# Patient Record
Sex: Female | Born: 1990 | Race: Black or African American | Hispanic: No | Marital: Single
Health system: Midwestern US, Academic
[De-identification: ages and names within clinical notes are randomized; demographics above are authoritative.]

## PROBLEM LIST (undated history)

## (undated) DIAGNOSIS — D693 Immune thrombocytopenic purpura: Secondary | ICD-10-CM

## (undated) DIAGNOSIS — O24419 Gestational diabetes mellitus in pregnancy, unspecified control: Secondary | ICD-10-CM

## (undated) DIAGNOSIS — O99113 Other diseases of the blood and blood-forming organs and certain disorders involving the immune mechanism complicating pregnancy, third trimester: Secondary | ICD-10-CM

## (undated) HISTORY — DX: Other diseases of the blood and blood-forming organs and certain disorders involving the immune mechanism complicating pregnancy, third trimester: D69.3

## (undated) HISTORY — DX: Immune thrombocytopenic purpura: O99.113

## (undated) HISTORY — DX: Gestational diabetes mellitus in pregnancy, unspecified control: O24.419

## (undated) LAB — PREPARE FRESH FROZEN PLASMA
Blood Expiration Date: 202403061619
Blood Expiration Date: 202403061619
Blood Expiration Date: 202403061947
Blood Expiration Date: 202403071454
Blood Expiration Date: 202403071454
Blood Expiration Date: 202403071454
Dispense Status: TRANSFUSED
Dispense Status: TRANSFUSED

## (undated) LAB — PREPARE RBC, LEUKOREDUCED
Blood Expiration Date: 202404012359
Blood Expiration Date: 202404012359
Blood Expiration Date: 202404012359
Blood Expiration Date: 202404012359
Blood Expiration Date: 202404022359
Blood Expiration Date: 202404022359
Dispense Status: TRANSFUSED
Dispense Status: TRANSFUSED

---

## 2012-04-10 HISTORY — PX: WISDOM TOOTH EXTRACTION: SHX21

## 2018-02-11 NOTE — Anesthesia Procedure Notes (Signed)
Associated Order(s): Anesthesia Block  Formatting of this note might be different from the original.  Anesthesia Block  Performed by: Leland Johns, DO  Date/Time: 02/11/2018 12:10 PM    Block Type:  Epidural - lumbarResident performed procedure    Timeout Time:  12:10  Block start date/time: 02/11/2018 12:10 PM  Laterality:  N/A  Indication: vaginal delivery  Sedation/Anelgesia for procedure: see MAR for details  Monitoring: continuous pulse oximetry, EKG, room air and non-invasive blood pressure  Prep: sterile gloves, cap, pre-procedure hand washing completed, sterile drape and surgical mask    Solution: Chlorhexidine    Preanesthetic Checklist: Patient identified, IV checked, Anesthesia informed consent obtained, Timeout performed, surgical consent and Pre- Anesthesia Record Complete    Needle:     Injection Technique:  Catheter    Type: Tuohy    Gauge: 18 G    Length: 9 cm    Catheter at Skin Depth:  11 cm  Site:  Interspace:  L3-4  Technique:   loss of resistance       Fixed to Skin with:  Clear adhesive dressing applied and Medical skin adhesive  Local Anesthetics:    Medications: Documented on Intra-procedure meds section (see MAR for details)     Test Dose:  1.5% lidocaine with Epi 1:200 K and negative    Dose:  5 mL    Injection made incrementally with aspirations every:  3 mL  Narrative:     Remarks:  No Blood Aspirated, No Air Aspirated, No Parasthesia and Injection without significant resistance    Percutaneous Insertions:  1  Notes:      Aseptic technique.  midline approach at L3-L4. Skin topicalized with 1% lidocaine.  Clear LOR to NS at 6 cm.  Catheter threaded easily without significant resistance or paraesthesia.  Negative aspiration and negative test dose. Catheter secured to skin at 11 cm with mastisol, tegaderm, and [place] paper tape.  Patient tolerated procedure well without immediate complications. Performed by Leland Johns, DO  under supervision of Dr. Mollie Germany.      Electronically signed by  Leland Johns, DO at 02/11/2018 12:30 PM EST

## 2018-02-11 NOTE — Anesthesia Pre-Procedure Evaluation (Signed)
Formatting of this note is different from the original.  ANESTHESIA EVALUATION    Patient Active Problem List   Diagnosis   ? Migraine   ? Depression   ? Supervision of other normal pregnancy, antepartum   ? Labor and delivery, indication for care   ? [redacted] weeks gestation of pregnancy     SUMMARY  Emily Haynes is a 27 y.o. [phone] F with [redacted]w[redacted]d who presents in labor. Patient requesting epidural for labor analgesia at this time.    Review of Systems / Medical History  Patient summary reviewed and Nursing notes reviewed  Anesthesia history - negative   (-)  PONV,  malignant hyperthermia  (-) family history of anesthetic complications  Pulmonary - negative ROS    (-) active smoker, COPD, asthma, shortness of breath  Neurological   (+) headaches (Tx with ibuprofen ),   (-)  seizures, TIA, CVA  Psychiatric   (+) depression,     Cardiovascular   Exercise tolerance: > 4 METs (active) (-) hypertension,  congenital heart disease, valvular problems/murmurs, no chest pain, angina  GI/Hepatic/Renal   (-) GERD, liver disease, renal disease  Endo/Other   (-) hypothyroidism, hyperthyroidism, no anemia, no coagulopathy    Physical Exam  Airway  Mallampati: II  TM distance: >3 FB  Neck ROM: full  Mouth Opening:good    Dental- normal exam    Cardiovascular    Rhythm: regular  Rate: normal   Pulmonary   breath sounds clear to auscultation    Mental Status   (+) alert and coherent  Abdomen   Other findings: Gravid uterus    Lab Results   Component Value Date    WBC 7.2 02/10/2018    HGB 8.4 (L) 02/10/2018    HCT 27.3 (L) 02/10/2018    MCV 70.0 (L) 02/10/2018    PLAT 137 (L) 02/10/2018    PLAT 160 06/12/2011     Anesthesia Plan  ASA:  2   Planned Anesthesia Type:  Epidural  Lines and Monitors: PIV    Informed Consent: Anesthetic plan and risks discussed with patient.  Use of blood products discussed with patient who.      Plan discussed with Resident and Attending.   ______________________________________    Electronically signed by  Karsten Ro, MD at 02/15/2018  2:33 PM EST    Associated attestation - Karsten Ro, MD - 02/15/2018  2:33 PM EST  Formatting of this note might be different from the original.  Agree with the above as documented by  Dr. Sherilyn Cooter.  Ilean China, MD

## 2018-02-11 NOTE — Anesthesia Post-Procedure Evaluation (Signed)
Formatting of this note might be different from the original.  Post Anesthesia Evaluation    Vital signs reviewed.    RESPIRATORY  Airway: maintains patency  Respiratory rate: within expected parameters  Oxygenation source: room air    CARDIOVASCULAR  Pulse: within expected parameters  Blood pressure: within expected parameters    MENTAL STATUS: Awake, alert, and appropriate.  At pre-procedure baseline.    TEMPERATURE: within expected parameters    PAIN: Adequately controlled. Post-procedure orders written.    NAUSEA/VOMITING: No acute nausea or vomiting    HYDRATION: appears adequately hydrated    I have evaluated this patient and spoken with the PACU nurse responsible for this patient. I have found that the above criteria have been met. Apparent complications attributable to anesthesia at this time none.    Epidural removed without incident. Tip intact.    Christia Reading, DO  Anesthesiology and Perioperative Medicine, PGY-2/CA-1  Pager [phone]  02/11/18      Electronically signed by Christia Reading, DO at 02/11/2018  9:25 PM EST

## 2019-04-02 ENCOUNTER — Inpatient Hospital Stay (HOSPITAL_COMMUNITY)
Admission: AD | Admit: 2019-04-02 | Discharge: 2019-04-02 | Disposition: A | Discharge status: Home / Self Care | Payer: Medicaid Other | Attending: Obstetrics and Gynecology | Admitting: Obstetrics and Gynecology

## 2019-04-02 ENCOUNTER — Encounter (HOSPITAL_COMMUNITY): Payer: Self-pay | Admitting: Obstetrics and Gynecology

## 2019-04-02 ENCOUNTER — Other Ambulatory Visit: Payer: Self-pay

## 2019-04-02 DIAGNOSIS — Z3201 Encounter for pregnancy test, result positive: Secondary | ICD-10-CM | POA: Diagnosis not present

## 2019-04-02 DIAGNOSIS — Z3A09 9 weeks gestation of pregnancy: Secondary | ICD-10-CM | POA: Insufficient documentation

## 2019-04-02 DIAGNOSIS — O210 Mild hyperemesis gravidarum: Secondary | ICD-10-CM

## 2019-04-02 LAB — URINALYSIS, ROUTINE W REFLEX MICROSCOPIC
Bilirubin Urine: NEGATIVE
Glucose, UA: NEGATIVE mg/dL
Hgb urine dipstick: NEGATIVE
Ketones, ur: NEGATIVE mg/dL
Leukocytes,Ua: NEGATIVE
Nitrite: NEGATIVE
Protein, ur: NEGATIVE mg/dL
Specific Gravity, Urine: 1.009 (ref 1.005–1.030)
pH: 6 (ref 5.0–8.0)

## 2019-04-02 LAB — POCT PREGNANCY, URINE: Preg Test, Ur: POSITIVE — AB

## 2019-04-02 NOTE — Discharge Instructions (Signed)
Morning Sickness  Morning sickness is when you feel sick to your stomach (nauseous) during pregnancy. You may feel sick to your stomach and throw up (vomit). You may feel sick in the morning, but you can feel this way at any time of day. Some women feel very sick to their stomach and cannot stop throwing up (hyperemesis gravidarum). Follow these instructions at home: Medicines  Take over-the-counter and prescription medicines only as told by your doctor. Do not take any medicines until you talk with your doctor about them first.  Taking multivitamins before getting pregnant can stop or lessen the harshness of morning sickness. Eating and drinking  Eat dry toast or crackers before getting out of bed.  Eat 5 or 6 small meals a day.  Eat dry and bland foods like rice and baked potatoes.  Do not eat greasy, fatty, or spicy foods.  Have someone cook for you if the smell of food causes you to feel sick or throw up.  If you feel sick to your stomach after taking prenatal vitamins, take them at night or with a snack.  Eat protein when you need a snack. Nuts, yogurt, and cheese are good choices.  Drink fluids throughout the day.  Try ginger ale made with real ginger, ginger tea made from fresh grated ginger, or ginger candies. General instructions  Do not use any products that have nicotine or tobacco in them, such as cigarettes and e-cigarettes. If you need help quitting, ask your doctor.  Use an air purifier to keep the air in your house free of smells.  Get lots of fresh air.  Try to avoid smells that make you feel sick.  Try: ? Wearing a bracelet that is used for seasickness (acupressure wristband). ? Going to a doctor who puts thin needles into certain body points (acupuncture) to improve how you feel. Contact a doctor if:  You need medicine to feel better.  You feel dizzy or light-headed.  You are losing weight. Get help right away if:  You feel very sick to your  stomach and cannot stop throwing up.  You pass out (faint).  You have very bad pain in your belly. Summary  Morning sickness is when you feel sick to your stomach (nauseous) during pregnancy.  You may feel sick in the morning, but you can feel this way at any time of day.  Making some changes to what you eat may help your symptoms go away. This information is not intended to replace advice given to you by your health care provider. Make sure you discuss any questions you have with your health care provider. Document Released: 05/04/2004 Document Revised: 03/09/2017 Document Reviewed: 04/27/2016 Elsevier Patient Education  2020 [place] for [employer] at [place]      Phone: [phone]  [facility] [employer] at [place]   Phone: [place] for [employer] at [place]  Phone: [place] for [place] at [facility]  Phone: [place] for [place] at [place]  Phone: [place] for [place] at [facility]   Phone: [facility]       Phone: [phone]  [facility]    Phone: [phone]   [facility]    Phone: [phone]  [facility]    Phone: [phone]  [place]    Phone: [phone]  [facility]  Department-Family Planning       Phone: [phone]   [facility]-Maternity  Phone: [phone]  [facility]    Phone: [phone]  [facility]   Phone: [phone]  [facility]      Phone: [phone]  [facility]    Phone: [phone]     ________________________________________     To schedule your Maternity Eligibility Appointment, please call [phone].  When you arrive for your  appointment you must bring the following items or information listed below.  Your appointment will be rescheduled if you do not have these items or are 15 minutes late. If currently receiving Medicaid, you MUST bring: 1. Medicaid Card 2. Social Security Card 3. Picture ID 4. Proof of Pregnancy 5. Verification of current address if the address on Medicaid card is incorrect "postmarked mail" If not receiving Medicaid, you MUST bring: 1. Social Security Card 2. Picture ID 3. Birth Certificate (if available) Passport or *Green Card 4. Proof of Pregnancy 5. Verification of current address "postmarked mail" for each income presented. 6. Verification of insurance coverage, if any 7. Check stubs from each employer for the previous month (if unable to present check stub  for each week, we will accept check stub for the first and last week ill the same month.) If you can't locate check stubs, you must bring a letter from the employer(s) and it must have the following information on letterhead, typed, in English: o name of company o company telephone number o how long been with the company, if less than one month o how much person earns per hour o how many hours per week work o the gross pay the person earned for the previous month If you are 30 years old or less, you do not have to bring proof of income unless you work or live with the father of the baby and at that time we will need proof of income from you and/or the father of the baby. Green Card recipients are eligible for Medicaid for Pregnant Women (MPW)

## 2019-04-02 NOTE — MAU Provider Note (Signed)
  History     CSN: [id number]  Arrival date and time: 04/02/19 1432   First Provider Initiated Contact with Patient 04/02/19 1531      Chief Complaint  Patient presents with  . Nausea  . Possible Pregnancy   28 y.o. G1 @[redacted]w[redacted]d  by LMP presenting with suspicion of pregnancy. Reports recent onset of nausea and fatigue. She has not taken a HPT. Reports nausea in her first pregnancy for which she took medicine but unsure of name. She recently moved here, does not have OBGYN.   OB History    Gravida  2   Para  1   Term      Preterm      AB      Living  1     SAB      TAB      Ectopic      Multiple      Live Births              No past medical history on file.  No family history on file.  Social History   Tobacco Use  . Smoking status: Not on file  Substance Use Topics  . Alcohol use: Not on file  . Drug use: Not on file    Allergies: Not on File  No medications prior to admission.    Review of Systems  Constitutional: Positive for fatigue.  Gastrointestinal: Positive for nausea. Negative for abdominal pain and vomiting.  Genitourinary: Negative for vaginal bleeding.   Physical Exam   Blood pressure 119/80, pulse 83, temperature 99.3 F (37.4 C), temperature source Oral, resp. rate 17, weight 78.7 kg, last menstrual period 01/26/2019, SpO2 100 %.  Physical Exam  Nursing note and vitals reviewed. Constitutional: She is oriented to person, place, and time. She appears well-developed and well-nourished. No distress.  HENT:  Head: Normocephalic and atraumatic.  Cardiovascular: Normal rate.  Respiratory: Effort normal. No respiratory distress.  Musculoskeletal:        General: Normal range of motion.     Cervical back: Normal range of motion.  Neurological: She is alert and oriented to person, place, and time.  Psychiatric: She has a normal mood and affect.   Results for orders placed or performed during the hospital encounter of 04/02/19 (from  the past 24 hour(s))  Pregnancy, urine POC     Status: Abnormal   Collection Time: 04/02/19  3:19 PM  Result Value Ref Range   Preg Test, Ur POSITIVE (A) NEGATIVE   MAU Course  Procedures  MDM +UPT, pt notified. Discussed OTC treatments for morning sickness- list provided. Pregnancy verification letter and OB provider list provided. Stable for discharge home.   Assessment and Plan   1. Positive pregnancy test   2. Morning sickness    Discharge home Follow up with OB provider of choice to start care Return precautions  Allergies as of 04/02/2019   Not on File     Medication List    You have not been prescribed any medications.    Julianne Handler, CNM 04/02/2019, 3:51 PM

## 2019-04-02 NOTE — MAU Note (Signed)
Had implant removed 12/23/18, has only had one period since removed- only lasted 2 days..  Hasn't seen period for 1 or 2 months.  Having some nausea, fatigue. Did not do home test. "no pain, just feeling ? Kicking, or things moving?"

## 2019-04-11 NOTE — L&D Delivery Note (Addendum)
OB/GYN Faculty Practice Delivery Note  Kaitlin [name] is a 29 y.o. [id number] s/p vaginal delivery at [redacted]w[redacted]d. She was admitted for IOL 2/2 uncontrolled GDM.   ROM: 3h 71m with clear fluid GBS Status: negative Maximum Maternal Temperature: 98.8*F  Labor Progress: . Admitted for IOL 2/2 uncontrolled diabetes. Cervix was favorable, so she was started on pitocin. She was eventually AROmed and progressed to complete.  Delivery Date/Time: 10/30/19, 17:04  Delivery: Called to room and patient was complete and head was crowning. Head delivered OA by Dr. Germaine Pomfret. No nuchal cord present, however shoulder dystocia was noted when the head did not restitute. Shoulder dystocia identified and help called for. Foley catheter remained in place as she had an epidural for pain relief.  00:00 Head delivered 01:00 Shoulder dystocia identified and help called for 01:30 Dr. Salomon Mast took over for Dr. Germaine Pomfret, Wood's Screw attempted 02:00 Placed onto side and posterior arm attempted 02:30 Dr. Reina Fuse and Dr. Juliene Pina at bedside 02:35 Dr. Reina Fuse took over for Dr. Salomon Mast 02:45 Dr. Vergie Living at bedside  03:00 Dr. Reina Fuse attempted posterior arm 03:30 Dr. Reina Fuse cut episiotomy 03:45 Humerus broken, posterior arm delivered 04:00 Delivery of the infant   Cord immediately clamped x 2 and cut by Dr. Germaine Pomfret, infant brought immediately to warmer by Dr. Salomon Mast for resuscitation by awaiting Neonatology team. Arterial and venous cord gases drawn. Cord blood drawn. Placenta delivered spontaneously with gentle cord traction.   Due to blood loss, 1 g TXA was given, as was 0.2 mg Methergine IM. Fundus firm with massage and Pitocin. Labia, perineum, vagina, and cervix were inspected, and a 3A laceration was noted- repaired with 2-0 Vicryl in the usual fashion with the assistance of Dr. Vergie Living.   After repair, there was another large gush of blood with fundal massage. 1000 mcg rectal cytotec given at that time and the  uterus was swept with about 300 cc of clot removed.  2 g Ancef ordered for both 3A lac repair and manual uterine sweep.  Placenta: intact, 3 vessel cord, to L&D Complications: 4:00 shoulder dystocia, as outlined above Lacerations: 3A laceration, 0.5cm in depth- repaired with Vicryl in the usual fashion QBL: 1809 mL Analgesia: epidural, 1%lidocaine for repair  Postpartum Planning [x]  message to sent to schedule follow-up  [x]  vaccines UTD  Infant: female  APGARs 9 at 5 minutes  4380 g  , DO OB/GYN Fellow, Faculty Practice

## 2019-04-17 ENCOUNTER — Ambulatory Visit (INDEPENDENT_AMBULATORY_CARE_PROVIDER_SITE_OTHER): Payer: Self-pay

## 2019-04-17 ENCOUNTER — Telehealth: Payer: Self-pay

## 2019-04-17 DIAGNOSIS — Z8751 Personal history of pre-term labor: Secondary | ICD-10-CM | POA: Insufficient documentation

## 2019-04-17 DIAGNOSIS — O0990 Supervision of high risk pregnancy, unspecified, unspecified trimester: Secondary | ICD-10-CM

## 2019-04-17 DIAGNOSIS — O09899 Supervision of other high risk pregnancies, unspecified trimester: Secondary | ICD-10-CM

## 2019-04-17 MED ORDER — BLOOD PRESSURE KIT DEVI: 1.0000 | 0 refills | Status: DC | PRN

## 2019-04-17 MED ORDER — DOXYLAMINE-PYRIDOXINE 10-10 MG PO TBEC: DELAYED_RELEASE_TABLET | ORAL | 6 refills | Status: DC

## 2019-04-17 NOTE — Progress Notes (Signed)
I have reviewed this chart and agree with the RN/CMA assessment and management.    K. Meryl Davis, M.D. Attending [facility] (Faculty Practice)   

## 2019-04-17 NOTE — Telephone Encounter (Signed)
Pt LVM  Unable to reach pt; message says "the person you are trying to reach is not taking calls at this time"

## 2019-04-17 NOTE — Patient Instructions (Addendum)
Preventing Preterm Birth Preterm birth is when your baby is delivered between 20 weeks and 37 weeks of pregnancy. A full-term pregnancy lasts for at least 37 weeks. Preterm birth can be dangerous for your baby because the last few weeks of pregnancy are an important time for your baby's brain and lungs to grow. Many things can cause a baby to be born early. Sometimes the cause is not known. There are certain factors that make you more likely to experience preterm birth, such as:  Having a previous baby born preterm.  Being pregnant with twins or other multiples.  Having had fertility treatment.  Being overweight or underweight at the start of your pregnancy.  Having any of the following during pregnancy: ? An infection, including a urinary tract infection (UTI) or an STI (sexually transmitted infection). ? High blood pressure. ? Diabetes. ? Vaginal bleeding.  Being age 35 or older.  Being age 18 or younger.  Getting pregnant within 6 months of a previous pregnancy.  Suffering extreme stress or physical or emotional abuse during pregnancy.  Standing for long periods of time during pregnancy, such as working at a job that requires standing. What are the risks? The most serious risk of preterm birth is that the baby may not survive. This is more likely to happen if a baby is born before 34 weeks. Other risks and complications of preterm birth may include your baby having:  Breathing problems.  Brain damage that affects movement and coordination (cerebral palsy).  Feeding difficulties.  Vision or hearing problems.  Infections or inflammation of the digestive tract (colitis).  Developmental delays.  Learning disabilities.  Higher risk for diabetes, heart disease, and high blood pressure later in life. What can I do to lower my risk?  Medical care The most important thing you can do to lower your risk for preterm birth is to get routine medical care during pregnancy (prenatal  care). If you have a high risk of preterm birth, you may be referred to a health care provider who specializes in managing high-risk pregnancies (perinatologist). You may be given medicine to help prevent preterm birth. Lifestyle changes Certain lifestyle changes can also lower your risk of preterm birth:  Wait at least 6 months after a pregnancy to become pregnant again.  Try to plan pregnancy for when you are between 19 and 35 years old.  Get to a healthy weight before getting pregnant. If you are overweight, work with your health care provider to safely lose weight.  Do not use any products that contain nicotine or tobacco, such as cigarettes and e-cigarettes. If you need help quitting, ask your health care provider.  Do not drink alcohol.  Do not use drugs. Where to find support For more support, consider:  Talking with your health care provider.  Talking with a therapist or substance abuse counselor, if you need help quitting.  Working with a diet and nutrition specialist (dietitian) or a personal trainer to maintain a healthy weight.  Joining a support group. Where to find more information Learn more about preventing preterm birth from:  Centers for Disease Control and Prevention: cdc.gov/reproductivehealth/maternalinfanthealth/pretermbirth.htm  March of Dimes: marchofdimes.org/complications/premature-babies.aspx  American Pregnancy Association: americanpregnancy.org/labor-and-birth/premature-labor Contact a health care provider if:  You have any of the following signs of preterm labor before 37 weeks: ? A change or increase in vaginal discharge. ? Fluid leaking from your vagina. ? Pressure or cramps in your lower abdomen. ? A backache that does not go away or gets worse. ?   Regular tightening (contractions) in your lower abdomen. Summary  Preterm birth means having your baby during weeks 20-37 of pregnancy.  Preterm birth may put your baby at risk for physical and  mental problems.  Getting good prenatal care can help prevent preterm birth.  You can lower your risk of preterm birth by making certain lifestyle changes, such as not smoking and not using alcohol. This information is not intended to replace advice given to you by your health care provider. Make sure you discuss any questions you have with your health care provider. Document Revised: 03/09/2017 Document Reviewed: 12/04/2015 Elsevier Patient Education  2020 Else[employer] Inc[employer]   Morning Sickness  Morning sickness is when you feel sick to your stomach (nauseous) during pregnancy. You may feel sick to your stomach and throw up (vomit). You may feel sick in the morning, but you can feel this way at any time of day. Some women feel very sick to their stomach and cannot stop throwing up (hyperemesis gravidarum). Follow these instructions at home: Medicines  Take over-the-counter and prescription medicines only as told by your doctor. Do not take any medicines until you talk with your doctor about them first.  Taking multivitamins before getting pregnant can stop or lessen the harshness of morning sickness. Eating and drinking  Eat dry toast or crackers before getting out of bed.  Eat 5 or 6 small meals a day.  Eat dry and bland foods like rice and baked potatoes.  Do not eat greasy, fatty, or spicy foods.  Have someone cook for you if the smell of food causes you to feel sick or throw up.  If you feel sick to your stomach after taking prenatal vitamins, take them at night or with a snack.  Eat protein when you need a snack. Nuts, yogurt, and cheese are good choices.  Drink fluids throughout the day.  Try ginger ale made with real ginger, ginger tea made from fresh grated ginger, or ginger candies. General instructions  Do not use any products that have nicotine or tobacco in them, such as cigarettes and e-cigarettes. If you need help quitting, ask your doctor.  Use an air purifier to  keep the air in your house free of smells.  Get lots of fresh air.  Try to avoid smells that make you feel sick.  Try: ? Wearing a bracelet that is used for seasickness (acupressure wristband). ? Going to a doctor who puts thin needles into certain body points (acupuncture) to improve how you feel. Contact a doctor if:  You need medicine to feel better.  You feel dizzy or light-headed.  You are losing weight. Get help right away if:  You feel very sick to your stomach and cannot stop throwing up.  You pass out (faint).  You have very bad pain in your belly. Summary  Morning sickness is when you feel sick to your stomach (nauseous) during pregnancy.  You may feel sick in the morning, but you can feel this way at any time of day.  Making some changes to what you eat may help your symptoms go away. This information is not intended to replace advice given to you by your health care provider. Make sure you discuss any questions you have with your health care provider. Document Revised: 03/09/2017 Document Reviewed: 04/27/2016 Elsevier Patient Education  2020 [employer].

## 2019-04-17 NOTE — Progress Notes (Signed)
  Virtual Visit via Telephone Note  I connected with Kaitlin [name] on 04/17/19 at 10:30 AM EST by telephone and verified that I am speaking with the correct person using two identifiers.  Location: Patient: Kaitlin [name]  Provider: Eduard Roux, CMA    I discussed the limitations, risks, security and privacy concerns of performing an evaluation and management service by telephone and the availability of in person appointments. I also discussed with the patient that there may be a patient responsible charge related to this service. The patient expressed understanding and agreed to proceed.   History of Present Illness: PRENATAL INTAKE SUMMARY  Kaitlin [name] presents today New OB Nurse Interview.  OB History    Gravida  5   Para  4   Term  3   Preterm  1   AB      Living  4     SAB      TAB      Ectopic      Multiple      Live Births  4          I have reviewed the patient's medical, obstetrical, social, and family histories, medications, and available lab results.  SUBJECTIVE She c/o morning sickness.   Observations/Objective: Initial nurse interview for history/labs (New OB)  EDD: 11/02/2019 GA: [redacted]w[redacted]d GP: [id number] FHT: non face to face interview   GENERAL APPEARANCE: alert, non face to face interview   Assessment and Plan: High Risk pregnancy hx of PTL @ 36 weeks Hx of Makena use - pt hesitate to receive inj this pregnancy Prenatal care - [facility]  Labs to be completed at provider visit Pt to download Babyscripts explained in detail Pt to download Mychart BP cuff ordered Diclegis sent to pharmacy per protocol    Follow Up Instructions:   I discussed the assessment and treatment plan with the patient. The patient was provided an opportunity to ask questions and all were answered. The patient agreed with the plan and demonstrated an understanding of the instructions.   The patient was advised to call back or seek an in-person evaluation if the symptoms  worsen or if the condition fails to improve as anticipated.  I provided 20 minutes of non-face-to-face time during this encounter.   Dalphine Handing, CMA

## 2019-04-23 ENCOUNTER — Other Ambulatory Visit (HOSPITAL_COMMUNITY)
Admission: RE | Admit: 2019-04-23 | Discharge: 2019-04-23 | Disposition: A | Discharge status: Home / Self Care | Payer: Medicaid Other | Source: Ambulatory Visit | Attending: Obstetrics | Admitting: Obstetrics

## 2019-04-23 ENCOUNTER — Other Ambulatory Visit: Payer: Self-pay

## 2019-04-23 ENCOUNTER — Encounter: Payer: Self-pay | Admitting: Obstetrics

## 2019-04-23 ENCOUNTER — Ambulatory Visit (INDEPENDENT_AMBULATORY_CARE_PROVIDER_SITE_OTHER): Payer: Self-pay | Admitting: Obstetrics

## 2019-04-23 VITALS — BP 122/75 | HR 89 | Temp 99.0°F | Wt 174.4 lb

## 2019-04-23 DIAGNOSIS — O09891 Supervision of other high risk pregnancies, first trimester: Secondary | ICD-10-CM

## 2019-04-23 DIAGNOSIS — O0990 Supervision of high risk pregnancy, unspecified, unspecified trimester: Secondary | ICD-10-CM | POA: Diagnosis not present

## 2019-04-23 DIAGNOSIS — Z3A12 12 weeks gestation of pregnancy: Secondary | ICD-10-CM

## 2019-04-23 DIAGNOSIS — O0991 Supervision of high risk pregnancy, unspecified, first trimester: Secondary | ICD-10-CM

## 2019-04-23 DIAGNOSIS — O09899 Supervision of other high risk pregnancies, unspecified trimester: Secondary | ICD-10-CM

## 2019-04-23 NOTE — Progress Notes (Signed)
Subjective:    Kaitlin [name] is being seen today for her first obstetrical visit.  This is a planned pregnancy. She is at [redacted]w[redacted]d gestation. Her obstetrical history is significant for previous preterm delivery. Relationship with FOB: spouse, living together. Patient does intend to breast feed. Pregnancy history fully reviewed.  The information documented in the HPI was reviewed and verified.  Menstrual History: OB History    Gravida  5   Para  4   Term  3   Preterm  1   AB      Living  4     SAB      TAB      Ectopic      Multiple      Live Births  4            Patient's last menstrual period was 01/26/2019 (approximate).    History reviewed. No pertinent past medical history.  Past Surgical History:  Procedure Laterality Date  . WISDOM TOOTH EXTRACTION  2014    (Not in a hospital admission)  No Known Allergies  Social History   Tobacco Use  . Smoking status: Never Smoker  . Smokeless tobacco: Never Used  Substance Use Topics  . Alcohol use: Never    History reviewed. No pertinent family history.   Review of Systems Constitutional: negative for weight loss Gastrointestinal: negative for vomiting Genitourinary:negative for genital lesions and vaginal discharge and dysuria Musculoskeletal:negative for back pain Behavioral/Psych: negative for abusive relationship, depression, illegal drug usage and tobacco use    Objective:    BP 122/75   Pulse 89   Temp 99 F (37.2 C)   Wt 174 lb 6.4 oz (79.1 kg)   LMP 01/26/2019 (Approximate)   BMI 29.94 kg/m  General Appearance:    Alert, cooperative, no distress, appears stated age  Head:    Normocephalic, without obvious abnormality, atraumatic  Eyes:    PERRL, conjunctiva/corneas clear, EOM's intact, fundi    benign, both eyes  Ears:    Normal TM's and external ear canals, both ears  Nose:   Nares normal, septum midline, mucosa normal, no drainage    or sinus tenderness  Throat:   Lips, mucosa, and  tongue normal; teeth and gums normal  Neck:   Supple, symmetrical, trachea midline, no adenopathy;    thyroid:  no enlargement/tenderness/nodules; no carotid   bruit or JVD  Back:     Symmetric, no curvature, ROM normal, no CVA tenderness  Lungs:     Clear to auscultation bilaterally, respirations unlabored  Chest Wall:    No tenderness or deformity   Heart:    Regular rate and rhythm, S1 and S2 normal, no murmur, rub   or gallop  Breast Exam:    No tenderness, masses, or nipple abnormality  Abdomen:     Soft, non-tender, bowel sounds active all four quadrants,    no masses, no organomegaly  Genitalia:    Normal female without lesion, discharge or tenderness  Extremities:   Extremities normal, atraumatic, no cyanosis or edema  Pulses:   2+ and symmetric all extremities  Skin:   Skin color, texture, turgor normal, no rashes or lesions  Lymph nodes:   Cervical, supraclavicular, and axillary nodes normal  Neurologic:   CNII-XII intact, normal strength, sensation and reflexes    throughout      Lab Review Urine pregnancy test Labs reviewed yes Radiologic studies reviewed no Assessment:    Pregnancy at [redacted]w[redacted]d weeks    Plan:  1. Supervision of high risk pregnancy, antepartum Rx: - Obstetric Panel, Including HIV - Hemoglobinopathy evaluation - Cystic Fibrosis Mutation 97 - SMN1 Copy Number Analysis - Urine Culture - HgB A1c - Glucose - Cytology - PAP( [place]) - Cervicovaginal ancillary only( [place])  2. History of preterm delivery, currently pregnant   Prenatal vitamins.  Counseling provided regarding continued use of seat belts, cessation of alcohol consumption, smoking or use of illicit drugs; infection precautions i.e., influenza/TDAP immunizations, toxoplasmosis,CMV, parvovirus, listeria and varicella; workplace safety, exercise during pregnancy; routine dental care, safe medications, sexual activity, hot tubs, saunas, pools, travel, caffeine use, fish and  methlymercury, potential toxins, hair treatments, varicose veins Weight gain recommendations per IOM guidelines reviewed: underweight/BMI< 18.5--> gain 28 - 40 lbs; normal weight/BMI 18.5 - 24.9--> gain 25 - 35 lbs; overweight/BMI 25 - 29.9--> gain 15 - 25 lbs; obese/BMI >30->gain  11 - 20 lbs Problem list reviewed and updated. FIRST/CF mutation testing/NIPT/QUAD SCREEN/fragile X/Ashkenazi Jewish population testing/Spinal muscular atrophy discussed: declined. Role of ultrasound in pregnancy discussed; fetal survey: requested. Amniocentesis discussed: not indicated.  No orders of the defined types were placed in this encounter.  Orders Placed This Encounter  Procedures  . Urine Culture  . Obstetric Panel, Including HIV  . Hemoglobinopathy evaluation  . Cystic Fibrosis Mutation 97  . SMN1 Copy Number Analysis  . HgB A1c  . Glucose    Follow up in 4 weeks. 50% of 25 min visit spent on counseling and coordination of care.     Shelly Bombard, MD 04/23/2019 11:14 AM

## 2019-04-24 ENCOUNTER — Other Ambulatory Visit: Payer: Self-pay | Admitting: Obstetrics

## 2019-04-24 DIAGNOSIS — B9689 Other specified bacterial agents as the cause of diseases classified elsewhere: Secondary | ICD-10-CM

## 2019-04-24 LAB — CERVICOVAGINAL ANCILLARY ONLY
Bacterial Vaginitis (gardnerella): POSITIVE — AB
Candida Glabrata: NEGATIVE
Candida Vaginitis: NEGATIVE
Chlamydia: NEGATIVE
Comment: NEGATIVE
Comment: NEGATIVE
Comment: NEGATIVE
Comment: NEGATIVE
Comment: NEGATIVE
Comment: NORMAL
Neisseria Gonorrhea: NEGATIVE
Trichomonas: NEGATIVE

## 2019-04-24 MED ORDER — CLINDAMYCIN HCL 300 MG PO CAPS: 300.0000 mg | ORAL_CAPSULE | Freq: Three times a day (TID) | ORAL | 0 refills | Status: DC

## 2019-04-25 ENCOUNTER — Other Ambulatory Visit: Payer: Self-pay | Admitting: Obstetrics and Gynecology

## 2019-04-25 LAB — URINE CULTURE

## 2019-04-25 MED ORDER — PROMETHAZINE HCL 25 MG PO TABS: 25.0000 mg | ORAL_TABLET | Freq: Four times a day (QID) | ORAL | 2 refills | Status: DC | PRN

## 2019-04-25 MED ORDER — METRONIDAZOLE 500 MG PO TABS: 500.0000 mg | ORAL_TABLET | Freq: Two times a day (BID) | ORAL | 0 refills | Status: DC

## 2019-04-28 LAB — CYTOLOGY - PAP
Comment: NEGATIVE
High risk HPV: POSITIVE — AB

## 2019-05-05 LAB — GLUCOSE, RANDOM: Glucose: 96 mg/dL (ref 65–99)

## 2019-05-05 LAB — SMN1 COPY NUMBER ANALYSIS (SMA CARRIER SCREENING)

## 2019-05-05 LAB — HEMOGLOBINOPATHY EVALUATION
HGB C: 0 %
HGB S: 0 %
HGB VARIANT: 0 %
Hemoglobin A2 Quantitation: 2.3 % (ref 1.8–3.2)
Hemoglobin F Quantitation: 0 % (ref 0.0–2.0)
Hgb A: 97.7 % (ref 96.4–98.8)

## 2019-05-21 ENCOUNTER — Encounter: Payer: Self-pay | Admitting: Obstetrics

## 2019-05-21 ENCOUNTER — Ambulatory Visit (INDEPENDENT_AMBULATORY_CARE_PROVIDER_SITE_OTHER): Payer: Medicaid Other | Admitting: Obstetrics

## 2019-05-21 ENCOUNTER — Other Ambulatory Visit: Payer: Self-pay

## 2019-05-21 ENCOUNTER — Encounter: Payer: Self-pay | Admitting: Obstetrics and Gynecology

## 2019-05-21 VITALS — BP 123/80 | HR 94 | Wt 183.0 lb

## 2019-05-21 DIAGNOSIS — O0990 Supervision of high risk pregnancy, unspecified, unspecified trimester: Secondary | ICD-10-CM

## 2019-05-21 DIAGNOSIS — O0992 Supervision of high risk pregnancy, unspecified, second trimester: Secondary | ICD-10-CM

## 2019-05-21 DIAGNOSIS — O09892 Supervision of other high risk pregnancies, second trimester: Secondary | ICD-10-CM

## 2019-05-21 DIAGNOSIS — O09899 Supervision of other high risk pregnancies, unspecified trimester: Secondary | ICD-10-CM

## 2019-05-21 DIAGNOSIS — R87612 Low grade squamous intraepithelial lesion on cytologic smear of cervix (LGSIL): Secondary | ICD-10-CM | POA: Insufficient documentation

## 2019-05-21 DIAGNOSIS — Z3A16 16 weeks gestation of pregnancy: Secondary | ICD-10-CM

## 2019-05-21 MED ORDER — PREPLUS 27-1 MG PO TABS: 1.0000 | ORAL_TABLET | Freq: Every day | ORAL | 13 refills | Status: DC

## 2019-05-21 NOTE — Progress Notes (Signed)
Subjective:  Kaitlin [name] is a 29 y.o. [id number] at 110w3d being seen today for ongoing prenatal care.  She is currently monitored for the following issues for this low-risk pregnancy and has Supervision of high risk pregnancy, antepartum and History of preterm delivery, currently pregnant on their problem list.  Patient reports nausea.  Contractions: Not present. Vag. Bleeding: None.  Movement: Present. Denies leaking of fluid.   The following portions of the patient's history were reviewed and updated as appropriate: allergies, current medications, past family history, past medical history, past social history, past surgical history and problem list. Problem list updated.  Objective:   Vitals:   05/21/19 1054  BP: 123/80  Pulse: 94  Weight: 183 lb (83 kg)    Fetal Status:     Movement: Present     General:  Alert, oriented and cooperative. Patient is in no acute distress.  Skin: Skin is warm and dry. No rash noted.   Cardiovascular: Normal heart rate noted  Respiratory: Normal respiratory effort, no problems with respiration noted  Abdomen: Soft, gravid, appropriate for gestational age. Pain/Pressure: Absent     Pelvic:  Cervical exam deferred        Extremities: Normal range of motion.  Edema: Trace  Mental Status: Normal mood and affect. Normal behavior. Normal judgment and thought content.   Urinalysis:      Assessment and Plan:  Pregnancy: [id number] at [redacted]w[redacted]d  1. Supervision of high risk pregnancy, antepartum Rx: - Prenatal Vit-Fe Fumarate-FA (PREPLUS) 27-1 MG TABS; Take 1 tablet by mouth daily.  Dispense: 30 tablet; Refill: 13 - [place] MFM OB DETAIL +14 WK; Future  2. History of preterm delivery, currently pregnant   Preterm labor symptoms and general obstetric precautions including but not limited to vaginal bleeding, contractions, leaking of fluid and fetal movement were reviewed in detail with the patient. Please refer to After Visit Summary for other counseling  recommendations.   Return in about 4 weeks (around 06/18/2019) for MyChart.   Brock Bad, MD

## 2019-05-21 NOTE — Progress Notes (Signed)
Pt is here for ROB, [redacted]w[redacted]d. Pt declines AFP test today.

## 2019-05-28 ENCOUNTER — Other Ambulatory Visit: Payer: Self-pay

## 2019-05-28 MED ORDER — DOXYLAMINE-PYRIDOXINE 10-10 MG PO TBEC: DELAYED_RELEASE_TABLET | ORAL | 6 refills | Status: DC

## 2019-06-10 ENCOUNTER — Ambulatory Visit (HOSPITAL_COMMUNITY): Payer: Medicaid Other

## 2019-06-10 ENCOUNTER — Inpatient Hospital Stay (HOSPITAL_COMMUNITY): Admission: RE | Admit: 2019-06-10 | Payer: Medicaid Other | Source: Ambulatory Visit

## 2019-06-18 ENCOUNTER — Telehealth (INDEPENDENT_AMBULATORY_CARE_PROVIDER_SITE_OTHER): Payer: Medicaid Other | Admitting: Family Medicine

## 2019-06-18 DIAGNOSIS — O0990 Supervision of high risk pregnancy, unspecified, unspecified trimester: Secondary | ICD-10-CM

## 2019-06-18 DIAGNOSIS — O09899 Supervision of other high risk pregnancies, unspecified trimester: Secondary | ICD-10-CM

## 2019-06-18 DIAGNOSIS — O0992 Supervision of high risk pregnancy, unspecified, second trimester: Secondary | ICD-10-CM

## 2019-06-18 DIAGNOSIS — O09892 Supervision of other high risk pregnancies, second trimester: Secondary | ICD-10-CM

## 2019-06-18 DIAGNOSIS — Z3A20 20 weeks gestation of pregnancy: Secondary | ICD-10-CM

## 2019-06-18 MED ORDER — BLOOD PRESSURE KIT DEVI: 1.0000 | 0 refills | Status: DC | PRN

## 2019-06-18 NOTE — Progress Notes (Signed)
   TELEHEALTH VIRTUAL OBSTETRICS VISIT ENCOUNTER NOTE  I connected with Kaitlin [name] on 06/18/19 at 10:55 AM EST by telephone at home and verified that I am speaking with the correct person using two identifiers.   I discussed the limitations, risks, security and privacy concerns of performing an evaluation and management service by telephone and the availability of in person appointments. I also discussed with the patient that there may be a patient responsible charge related to this service. The patient expressed understanding and agreed to proceed.  Subjective:  Kaitlin [name] is a 29 y.o. [id number] at 22w3dbeing followed for ongoing prenatal care.  She is currently monitored for the following issues for this high-risk pregnancy and has Supervision of high risk pregnancy, antepartum; History of preterm delivery, currently pregnant; and LGSIL on Pap smear of cervix on their problem list.  Patient reports no complaints. Reports fetal movement. Denies any contractions, bleeding or leaking of fluid.   The following portions of the patient's history were reviewed and updated as appropriate: allergies, current medications, past family history, past medical history, past social history, past surgical history and problem list.   Objective:  There were no vitals filed for this visit.  General:  Alert, oriented and cooperative.   Mental Status: Normal mood and affect perceived. Normal judgment and thought content.  Rest of physical exam deferred due to type of encounter  Assessment and Plan:  Pregnancy: [id number] 272w3derolyne was seen today for routine prenatal visit.  Diagnoses and all orders for this visit:  Supervision of high risk pregnancy, antepartum - patient has not yet completed OB panel; requested that patient be scheduled for lab visit at her earliest convenience - next appt can be virtual only if patient has received BP cuff by then - US[place]ore[place]uled later this month for  anatomy  History of preterm delivery, currently pregnant - first delivery at 36w and term pregnancies thereafter  Other orders -     Blood Pressure Monitoring (BLOOD PRESSURE KIT) DEVI; 1 Device by Does not apply route as needed. Check Blood Pressure regularly and record readings into the Babyscripts App.  Large Cuff.  DX O90.0   Preterm labor symptoms and general obstetric precautions including but not limited to vaginal bleeding, contractions, leaking of fluid and fetal movement were reviewed in detail with the patient.  I discussed the assessment and treatment plan with the patient. The patient was provided an opportunity to ask questions and all were answered. The patient agreed with the plan and demonstrated an understanding of the instructions. The patient was advised to call back or seek an in-person office evaluation/[facility] or concerning [facility]. Please refer to After Visit Summary for other counseling recommendations.   I provided 15 minutes of non-face-to-face time during this encounter.  [facility] in about 4 weeks (around 07/16/2019) [facility]  Future Appointments  Date Time Provider [place]3/01/2020 10:55 AM [name], ChMarin ShutterMD [facility] None  06/24/2019  9:30 AM [facility] NURSE [place]  06/24/2019  9:30 AM [place] [facility]    ChChauncey MannMD [facility]

## 2019-06-18 NOTE — Progress Notes (Signed)
I connected with  Kaitlin [name] on 06/18/19 by a video enabled telemedicine application and verified that I am speaking with the correct person using two identifiers.   I discussed the limitations of evaluation and management by telemedicine. The patient expressed understanding and agreed to proceed.   MyChart ROB.  BP Cuff reordered today, pt instructed to go and pick up cuff at [facility] and address given to pt.

## 2019-06-20 DIAGNOSIS — O900 Disruption of cesarean delivery wound: Secondary | ICD-10-CM | POA: Diagnosis not present

## 2019-06-24 ENCOUNTER — Other Ambulatory Visit: Payer: Self-pay

## 2019-06-24 ENCOUNTER — Ambulatory Visit (HOSPITAL_COMMUNITY)
Admission: RE | Admit: 2019-06-24 | Discharge: 2019-06-24 | Disposition: A | Discharge status: Home / Self Care | Payer: Medicaid Other | Source: Ambulatory Visit | Attending: Obstetrics and Gynecology | Admitting: Obstetrics and Gynecology

## 2019-06-24 ENCOUNTER — Encounter (HOSPITAL_COMMUNITY): Payer: Self-pay

## 2019-06-24 ENCOUNTER — Other Ambulatory Visit (HOSPITAL_COMMUNITY): Payer: Self-pay | Admitting: *Deleted

## 2019-06-24 ENCOUNTER — Ambulatory Visit (HOSPITAL_COMMUNITY): Payer: Medicaid Other | Admitting: *Deleted

## 2019-06-24 ENCOUNTER — Other Ambulatory Visit: Payer: Self-pay | Admitting: Obstetrics

## 2019-06-24 VITALS — BP 129/80 | HR 103 | Temp 97.6°F

## 2019-06-24 DIAGNOSIS — O09212 Supervision of pregnancy with history of pre-term labor, second trimester: Secondary | ICD-10-CM

## 2019-06-24 DIAGNOSIS — O0990 Supervision of high risk pregnancy, unspecified, unspecified trimester: Secondary | ICD-10-CM | POA: Insufficient documentation

## 2019-06-24 DIAGNOSIS — O09219 Supervision of pregnancy with history of pre-term labor, unspecified trimester: Secondary | ICD-10-CM | POA: Diagnosis not present

## 2019-06-24 DIAGNOSIS — O99212 Obesity complicating pregnancy, second trimester: Secondary | ICD-10-CM | POA: Diagnosis not present

## 2019-06-24 DIAGNOSIS — O365920 Maternal care for other known or suspected poor fetal growth, second trimester, not applicable or unspecified: Secondary | ICD-10-CM

## 2019-06-24 DIAGNOSIS — Z362 Encounter for other antenatal screening follow-up: Secondary | ICD-10-CM

## 2019-06-24 DIAGNOSIS — Z3A21 21 weeks gestation of pregnancy: Secondary | ICD-10-CM

## 2019-06-24 DIAGNOSIS — Z363 Encounter for antenatal screening for malformations: Secondary | ICD-10-CM

## 2019-07-14 ENCOUNTER — Other Ambulatory Visit: Payer: Self-pay | Admitting: Obstetrics and Gynecology

## 2019-07-16 ENCOUNTER — Other Ambulatory Visit: Payer: Self-pay

## 2019-07-16 ENCOUNTER — Ambulatory Visit (INDEPENDENT_AMBULATORY_CARE_PROVIDER_SITE_OTHER): Payer: Medicaid Other | Admitting: Obstetrics & Gynecology

## 2019-07-16 ENCOUNTER — Encounter: Payer: Medicaid Other | Admitting: Obstetrics & Gynecology

## 2019-07-16 VITALS — BP 125/80 | HR 109 | Wt 189.9 lb

## 2019-07-16 DIAGNOSIS — G43009 Migraine without aura, not intractable, without status migrainosus: Secondary | ICD-10-CM

## 2019-07-16 DIAGNOSIS — O09899 Supervision of other high risk pregnancies, unspecified trimester: Secondary | ICD-10-CM

## 2019-07-16 DIAGNOSIS — Z3A21 21 weeks gestation of pregnancy: Secondary | ICD-10-CM

## 2019-07-16 DIAGNOSIS — O0990 Supervision of high risk pregnancy, unspecified, unspecified trimester: Secondary | ICD-10-CM | POA: Diagnosis not present

## 2019-07-16 DIAGNOSIS — G43909 Migraine, unspecified, not intractable, without status migrainosus: Secondary | ICD-10-CM | POA: Insufficient documentation

## 2019-07-16 DIAGNOSIS — O99352 Diseases of the nervous system complicating pregnancy, second trimester: Secondary | ICD-10-CM

## 2019-07-16 DIAGNOSIS — O09212 Supervision of pregnancy with history of pre-term labor, second trimester: Secondary | ICD-10-CM

## 2019-07-16 MED ORDER — PROMETHAZINE HCL 25 MG PO TABS: 25.0000 mg | ORAL_TABLET | Freq: Four times a day (QID) | ORAL | 2 refills | Status: DC | PRN

## 2019-07-16 MED ORDER — PANTOPRAZOLE SODIUM 20 MG PO TBEC: 20.0000 mg | DELAYED_RELEASE_TABLET | Freq: Every day | ORAL | 1 refills | Status: DC

## 2019-07-16 NOTE — Patient Instructions (Signed)

## 2019-07-16 NOTE — Progress Notes (Signed)
HOB, c/o fatigue, nausea x 4 days.  Needs refills on Phenergan.

## 2019-07-16 NOTE — Progress Notes (Signed)
   PRENATAL VISIT NOTE  Subjective:  Kaitlin [name] is a 29 y.o. W7P7106 at [redacted]w[redacted]d being seen today for ongoing prenatal care.  She is currently monitored for the following issues for this high-risk pregnancy and has Supervision of high risk pregnancy, antepartum; History of preterm delivery, currently pregnant; LGSIL on Pap smear of cervix; and Migraine on their problem list.  Patient reports headache, heartburn and nausea.  Contractions: Not present. Vag. Bleeding: None.  Movement: Present. Denies leaking of fluid.   The following portions of the patient's history were reviewed and updated as appropriate: allergies, current medications, past family history, past medical history, past social history, past surgical history and problem list.   Objective:   Vitals:   07/16/19 0923  BP: 125/80  Pulse: (!) 109  Weight: 189 lb 14.4 oz (86.1 kg)    Fetal Status: Fetal Heart Rate (bpm): 149   Movement: Present     General:  Alert, oriented and cooperative. Patient is in no acute distress.  Skin: Skin is warm and dry. No rash noted.   Cardiovascular: Normal heart rate noted  Respiratory: Normal respiratory effort, no problems with respiration noted  Abdomen: Soft, gravid, appropriate for gestational age.  Pain/Pressure: Absent     Pelvic: Cervical exam deferred        Extremities: Normal range of motion.  Edema: None  Mental Status: Normal mood and affect. Normal behavior. Normal judgment and thought content.   Assessment and Plan:  Pregnancy: [id number] at [redacted]w[redacted]d 1. Supervision of high risk pregnancy, antepartum Reflux, nausea - promethazine (PHENERGAN) 25 MG tablet; Take 1 tablet (25 mg total) by mouth every 6 (six) hours as needed for nausea or vomiting.  Dispense: 30 tablet; Refill: 2 - pantoprazole (PROTONIX) 20 MG tablet; Take 1 tablet (20 mg total) by mouth daily.  Dispense: 30 tablet; Refill: 1  2. History of preterm delivery, currently pregnant F/u US 1 week, EDC reassigned due to  18-5 week [place]  3. Migraine without aura and without status migrainosus, not intractable   Preterm labor symptoms and general obstetric precautions including but not limited to vaginal bleeding, contractions, leaking of fluid and fetal movement were reviewed in detail with the patient. Please refer to After Visit Summary for other counseling recommendations.   Return in about 4 weeks (around 08/13/2019) for 2 hr GTT.  Future Appointments  Date Time Provider Department Center  07/18/2019  9:45 AM [facility] NURSE [facility]  07/18/2019  9:45 AM [facility] [place] 2 [facility]  08/14/2019  9:15 AM [facility] LAB [facility] None  08/14/2019  9:30 AM Adam Phenix, MD [facility] None    Scheryl Darter, MD

## 2019-07-18 ENCOUNTER — Ambulatory Visit (HOSPITAL_COMMUNITY): Payer: Medicaid Other | Admitting: *Deleted

## 2019-07-18 ENCOUNTER — Ambulatory Visit (HOSPITAL_COMMUNITY)
Admission: RE | Admit: 2019-07-18 | Discharge: 2019-07-18 | Disposition: A | Discharge status: Home / Self Care | Payer: Medicaid Other | Source: Ambulatory Visit | Attending: Obstetrics and Gynecology | Admitting: Obstetrics and Gynecology

## 2019-07-18 ENCOUNTER — Other Ambulatory Visit: Payer: Self-pay

## 2019-07-18 ENCOUNTER — Other Ambulatory Visit (HOSPITAL_COMMUNITY): Payer: Self-pay | Admitting: *Deleted

## 2019-07-18 ENCOUNTER — Encounter (HOSPITAL_COMMUNITY): Payer: Self-pay

## 2019-07-18 VITALS — BP 123/78 | HR 103 | Temp 97.4°F

## 2019-07-18 DIAGNOSIS — O99212 Obesity complicating pregnancy, second trimester: Secondary | ICD-10-CM

## 2019-07-18 DIAGNOSIS — Z362 Encounter for other antenatal screening follow-up: Secondary | ICD-10-CM | POA: Diagnosis not present

## 2019-07-18 DIAGNOSIS — O0990 Supervision of high risk pregnancy, unspecified, unspecified trimester: Secondary | ICD-10-CM

## 2019-07-18 DIAGNOSIS — Z3A11 11 weeks gestation of pregnancy: Secondary | ICD-10-CM

## 2019-07-18 DIAGNOSIS — O365920 Maternal care for other known or suspected poor fetal growth, second trimester, not applicable or unspecified: Secondary | ICD-10-CM

## 2019-07-18 DIAGNOSIS — O09899 Supervision of other high risk pregnancies, unspecified trimester: Secondary | ICD-10-CM

## 2019-07-18 DIAGNOSIS — Z3687 Encounter for antenatal screening for uncertain dates: Secondary | ICD-10-CM

## 2019-07-18 DIAGNOSIS — O09212 Supervision of pregnancy with history of pre-term labor, second trimester: Secondary | ICD-10-CM | POA: Diagnosis not present

## 2019-07-22 ENCOUNTER — Other Ambulatory Visit: Payer: Self-pay | Admitting: Obstetrics

## 2019-07-22 DIAGNOSIS — O24415 Gestational diabetes mellitus in pregnancy, controlled by oral hypoglycemic drugs: Secondary | ICD-10-CM

## 2019-07-22 DIAGNOSIS — O99019 Anemia complicating pregnancy, unspecified trimester: Secondary | ICD-10-CM

## 2019-07-22 LAB — OBSTETRIC PANEL, INCLUDING HIV
Antibody Screen: NEGATIVE
Basophils Absolute: 0 10*3/uL (ref 0.0–0.2)
Basos: 0 %
EOS (ABSOLUTE): 0.1 10*3/uL (ref 0.0–0.4)
Eos: 1 %
HIV Screen 4th Generation wRfx: NONREACTIVE
Hematocrit: 31.2 % — ABNORMAL LOW (ref 34.0–46.6)
Hemoglobin: 9.9 g/dL — ABNORMAL LOW (ref 11.1–15.9)
Hepatitis B Surface Ag: NEGATIVE
Immature Grans (Abs): 0 10*3/uL (ref 0.0–0.1)
Immature Granulocytes: 1 %
Lymphocytes Absolute: 1.6 10*3/uL (ref 0.7–3.1)
Lymphs: 25 %
MCH: 23.2 pg — ABNORMAL LOW (ref 26.6–33.0)
MCHC: 31.7 g/dL (ref 31.5–35.7)
MCV: 73 fL — ABNORMAL LOW (ref 79–97)
Monocytes Absolute: 0.4 10*3/uL (ref 0.1–0.9)
Monocytes: 7 %
Neutrophils Absolute: 4.3 10*3/uL (ref 1.4–7.0)
Neutrophils: 66 %
Platelets: 238 10*3/uL (ref 150–450)
RBC: 4.27 x10E6/uL (ref 3.77–5.28)
RDW: 13.4 % (ref 11.7–15.4)
RPR Ser Ql: NONREACTIVE
Rh Factor: POSITIVE
Rubella Antibodies, IGG: 4.19 index (ref >0.99)
WBC: 6.4 10*3/uL (ref 3.4–10.8)

## 2019-07-22 LAB — CYSTIC FIBROSIS MUTATION 97: Interpretation: NOT DETECTED

## 2019-07-22 LAB — HEMOGLOBIN A1C
Est. average glucose Bld gHb Est-mCnc: 154 mg/dL
Hgb A1c MFr Bld: 7 % — ABNORMAL HIGH (ref 4.8–5.6)

## 2019-07-22 MED ORDER — FERROUS SULFATE 325 (65 FE) MG PO TABS: 325.0000 mg | ORAL_TABLET | Freq: Two times a day (BID) | ORAL | 5 refills | Status: DC

## 2019-07-22 MED ORDER — METFORMIN HCL ER 500 MG PO TB24: 1000.0000 mg | ORAL_TABLET | Freq: Two times a day (BID) | ORAL | 11 refills | Status: DC

## 2019-07-23 ENCOUNTER — Telehealth: Payer: Self-pay

## 2019-07-23 MED ORDER — ACCU-CHEK GUIDE W/DEVICE KIT: 1.0000 | PACK | Freq: Four times a day (QID) | 0 refills | Status: DC

## 2019-07-23 MED ORDER — ACCU-CHEK SOFTCLIX LANCETS MISC: 12 refills | Status: DC

## 2019-07-23 MED ORDER — GLUCOSE BLOOD VI STRP: ORAL_STRIP | 12 refills | Status: DC

## 2019-07-23 NOTE — Telephone Encounter (Signed)
S/w patient and advised of results, rx, and referral.

## 2019-07-30 ENCOUNTER — Encounter: Payer: Medicaid Other | Attending: Obstetrics | Admitting: Registered"

## 2019-08-14 ENCOUNTER — Other Ambulatory Visit: Payer: Medicaid Other

## 2019-08-14 ENCOUNTER — Encounter: Payer: Medicaid Other | Admitting: Obstetrics & Gynecology

## 2019-08-15 ENCOUNTER — Other Ambulatory Visit: Payer: Self-pay

## 2019-08-15 ENCOUNTER — Ambulatory Visit (HOSPITAL_COMMUNITY): Payer: Medicaid Other | Attending: Obstetrics and Gynecology

## 2019-08-15 ENCOUNTER — Ambulatory Visit: Payer: Medicaid Other | Admitting: *Deleted

## 2019-08-15 ENCOUNTER — Other Ambulatory Visit: Payer: Self-pay | Admitting: *Deleted

## 2019-08-15 VITALS — BP 111/68 | HR 109 | Temp 97.6°F

## 2019-08-15 DIAGNOSIS — O99212 Obesity complicating pregnancy, second trimester: Secondary | ICD-10-CM

## 2019-08-15 DIAGNOSIS — Z3687 Encounter for antenatal screening for uncertain dates: Secondary | ICD-10-CM

## 2019-08-15 DIAGNOSIS — E669 Obesity, unspecified: Secondary | ICD-10-CM

## 2019-08-15 DIAGNOSIS — O09212 Supervision of pregnancy with history of pre-term labor, second trimester: Secondary | ICD-10-CM

## 2019-08-15 DIAGNOSIS — O09899 Supervision of other high risk pregnancies, unspecified trimester: Secondary | ICD-10-CM | POA: Diagnosis not present

## 2019-08-15 DIAGNOSIS — O43192 Other malformation of placenta, second trimester: Secondary | ICD-10-CM | POA: Diagnosis not present

## 2019-08-15 DIAGNOSIS — O24410 Gestational diabetes mellitus in pregnancy, diet controlled: Secondary | ICD-10-CM

## 2019-08-15 DIAGNOSIS — O43199 Other malformation of placenta, unspecified trimester: Secondary | ICD-10-CM

## 2019-08-15 DIAGNOSIS — Z3A26 26 weeks gestation of pregnancy: Secondary | ICD-10-CM

## 2019-08-15 DIAGNOSIS — O365920 Maternal care for other known or suspected poor fetal growth, second trimester, not applicable or unspecified: Secondary | ICD-10-CM | POA: Diagnosis not present

## 2019-08-15 NOTE — Progress Notes (Signed)
Pt is not checking blood sugars or taking metformin. Planning to reschedule diabetic education appt.

## 2019-08-25 ENCOUNTER — Other Ambulatory Visit: Payer: Self-pay

## 2019-08-25 ENCOUNTER — Other Ambulatory Visit: Payer: Medicaid Other

## 2019-08-25 ENCOUNTER — Ambulatory Visit (INDEPENDENT_AMBULATORY_CARE_PROVIDER_SITE_OTHER): Payer: Medicaid Other | Admitting: Obstetrics & Gynecology

## 2019-08-25 VITALS — BP 117/70 | HR 110 | Wt 193.7 lb

## 2019-08-25 DIAGNOSIS — R7309 Other abnormal glucose: Secondary | ICD-10-CM

## 2019-08-25 DIAGNOSIS — O0990 Supervision of high risk pregnancy, unspecified, unspecified trimester: Secondary | ICD-10-CM

## 2019-08-25 MED ORDER — PANTOPRAZOLE SODIUM 20 MG PO TBEC: 20.0000 mg | DELAYED_RELEASE_TABLET | Freq: Every day | ORAL | 1 refills | Status: DC

## 2019-08-25 MED ORDER — PROMETHAZINE HCL 25 MG PO TABS: 25.0000 mg | ORAL_TABLET | Freq: Four times a day (QID) | ORAL | 2 refills | Status: DC | PRN

## 2019-08-25 NOTE — Progress Notes (Signed)
Pt was originally scheduled for ROB as well as 2 hr GTT today, pt had to bring her children with her to today's visit and would prefer to reschedule the GTT. Pt requests to still be seen by provider today, [redacted]w[redacted]d. Pt requesting refill on promethazine, she reports nausea.

## 2019-08-25 NOTE — Patient Instructions (Signed)

## 2019-08-25 NOTE — Progress Notes (Signed)
   PRENATAL VISIT NOTE  Subjective:  Kaitlin [name] is a 29 y.o. [id number] at [redacted]w[redacted]d being seen today for ongoing prenatal care.  She is currently monitored for the following issues for this high-risk pregnancy and has Supervision of high risk pregnancy, antepartum; History of preterm delivery, currently pregnant; LGSIL on Pap smear of cervix; and Migraine on their problem list.  Patient reports heartburn and nausea.  Contractions: Not present. Vag. Bleeding: None.  Movement: Present. Denies leaking of fluid.   The following portions of the patient's history were reviewed and updated as appropriate: allergies, current medications, past family history, past medical history, past social history, past surgical history and problem list.   Objective:   Vitals:   08/25/19 0920  BP: 117/70  Pulse: (!) 110  Weight: 193 lb 11.2 oz (87.9 kg)    Fetal Status: Fetal Heart Rate (bpm): 145   Movement: Present     General:  Alert, oriented and cooperative. Patient is in no acute distress.  Skin: Skin is warm and dry. No rash noted.   Cardiovascular: Normal heart rate noted  Respiratory: Normal respiratory effort, no problems with respiration noted  Abdomen: Soft, gravid, appropriate for gestational age.  Pain/Pressure: Absent     Pelvic: Cervical exam deferred        Extremities: Normal range of motion.  Edema: None  Mental Status: Normal mood and affect. Normal behavior. Normal judgment and thought content.   Assessment and Plan:  Pregnancy: I4P3295 at [redacted]w[redacted]d 1. Supervision of high risk pregnancy, antepartum Treat her nausea and reflux - pantoprazole (PROTONIX) 20 MG tablet; Take 1 tablet (20 mg total) by mouth daily.  Dispense: 30 tablet; Refill: 1 - promethazine (PHENERGAN) 25 MG tablet; Take 1 tablet (25 mg total) by mouth every 6 (six) hours as needed for nausea or vomiting.  Dispense: 30 tablet; Refill: 2  2. Elevated hemoglobin A1c Suspect DM but not testing, will do 2 hr asap  Preterm  labor symptoms and general obstetric precautions including but not limited to vaginal bleeding, contractions, leaking of fluid and fetal movement were reviewed in detail with the patient. Please refer to After Visit Summary for other counseling recommendations.   Return in about 1 week (around 09/01/2019).  Future Appointments  Date Time Provider Department Center  08/25/2019 11:00 AM Adam Phenix, MD [facility] None  09/02/2019  8:45 AM [facility] LAB [facility] None  09/12/2019  9:45 AM [facility] NURSE [facility]  09/12/2019  9:45 AM [facility] US5 [facility]    Scheryl Darter, MD

## 2019-09-02 ENCOUNTER — Other Ambulatory Visit: Payer: Medicaid Other

## 2019-09-02 ENCOUNTER — Other Ambulatory Visit: Payer: Self-pay

## 2019-09-02 ENCOUNTER — Encounter: Payer: Self-pay | Admitting: Advanced Practice Midwife

## 2019-09-02 ENCOUNTER — Ambulatory Visit (INDEPENDENT_AMBULATORY_CARE_PROVIDER_SITE_OTHER): Payer: Medicaid Other | Admitting: Advanced Practice Midwife

## 2019-09-02 VITALS — BP 127/84 | HR 107 | Wt 192.0 lb

## 2019-09-02 DIAGNOSIS — R7309 Other abnormal glucose: Secondary | ICD-10-CM | POA: Diagnosis not present

## 2019-09-02 DIAGNOSIS — O0990 Supervision of high risk pregnancy, unspecified, unspecified trimester: Secondary | ICD-10-CM

## 2019-09-02 DIAGNOSIS — O09213 Supervision of pregnancy with history of pre-term labor, third trimester: Secondary | ICD-10-CM

## 2019-09-02 DIAGNOSIS — Z3A28 28 weeks gestation of pregnancy: Secondary | ICD-10-CM

## 2019-09-02 DIAGNOSIS — O3443 Maternal care for other abnormalities of cervix, third trimester: Secondary | ICD-10-CM

## 2019-09-02 NOTE — Patient Instructions (Signed)
Third Trimester of Pregnancy The third trimester is from week 28 through week 40 (months 7 through 9). The third trimester is a time when the unborn baby (fetus) is growing rapidly. At the end of the ninth month, the fetus is about 20 inches in length and weighs 6-10 pounds. Body changes during your third trimester Your body will continue to go through many changes during pregnancy. The changes vary from woman to woman. During the third trimester:  Your weight will continue to increase. You can expect to gain 25-35 pounds (11-16 kg) by the end of the pregnancy.  You may begin to get stretch marks on your hips, abdomen, and breasts.  You may urinate more often because the fetus is moving lower into your pelvis and pressing on your bladder.  You may develop or continue to have heartburn. This is caused by increased hormones that slow down muscles in the digestive tract.  You may develop or continue to have constipation because increased hormones slow digestion and cause the muscles that push waste through your intestines to relax.  You may develop hemorrhoids. These are swollen veins (varicose veins) in the rectum that can itch or be painful.  You may develop swollen, bulging veins (varicose veins) in your legs.  You may have increased body aches in the pelvis, back, or thighs. This is due to weight gain and increased hormones that are relaxing your joints.  You may have changes in your hair. These can include thickening of your hair, rapid growth, and changes in texture. Some women also have hair loss during or after pregnancy, or hair that feels dry or thin. Your hair will most likely return to normal after your baby is born.  Your breasts will continue to grow and they will continue to become tender. A yellow fluid (colostrum) may leak from your breasts. This is the first milk you are producing for your baby.  Your belly button may stick out.  You may notice more swelling in your hands,  face, or ankles.  You may have increased tingling or numbness in your hands, arms, and legs. The skin on your belly may also feel numb.  You may feel short of breath because of your expanding uterus.  You may have more problems sleeping. This can be caused by the size of your belly, increased need to urinate, and an increase in your body's metabolism.  You may notice the fetus "dropping," or moving lower in your abdomen (lightening).  You may have increased vaginal discharge.  You may notice your joints feel loose and you may have pain around your pelvic bone. What to expect at prenatal visits You will have prenatal exams every 2 weeks until week 36. Then you will have weekly prenatal exams. During a routine prenatal visit:  You will be weighed to make sure you and the baby are growing normally.  Your blood pressure will be taken.  Your abdomen will be measured to track your baby's growth.  The fetal heartbeat will be listened to.  Any test results from the previous visit will be discussed.  You may have a cervical check near your due date to see if your cervix has softened or thinned (effaced).  You will be tested for Group B streptococcus. This happens between 35 and 37 weeks. Your health care provider may ask you:  What your birth plan is.  How you are feeling.  If you are feeling the baby move.  If you have had any abnormal   symptoms, such as leaking fluid, bleeding, severe headaches, or abdominal cramping.  If you are using any tobacco products, including cigarettes, chewing tobacco, and electronic cigarettes.  If you have any questions. Other tests or screenings that may be performed during your third trimester include:  Blood tests that check for low iron levels (anemia).  Fetal testing to check the health, activity level, and growth of the fetus. Testing is done if you have certain medical conditions or if there are problems during the pregnancy.  Nonstress test  (NST). This test checks the health of your baby to make sure there are no signs of problems, such as the baby not getting enough oxygen. During this test, a belt is placed around your belly. The baby is made to move, and its heart rate is monitored during movement. What is false labor? False labor is a condition in which you feel small, irregular tightenings of the muscles in the womb (contractions) that usually go away with rest, changing position, or drinking water. These are called Braxton Hicks contractions. Contractions may last for hours, days, or even weeks before true labor sets in. If contractions come at regular intervals, become more frequent, increase in intensity, or become painful, you should see your health care provider. What are the signs of labor?  Abdominal cramps.  Regular contractions that start at 10 minutes apart and become stronger and more frequent with time.  Contractions that start on the top of the uterus and spread down to the lower abdomen and back.  Increased pelvic pressure and dull back pain.  A watery or bloody mucus discharge that comes from the vagina.  Leaking of amniotic fluid. This is also known as your "water breaking." It could be a slow trickle or a gush. Let your health care provider know if it has a color or strange odor. If you have any of these signs, call your health care provider right away, even if it is before your due date. Follow these instructions at home: Medicines  Follow your health care provider's instructions regarding medicine use. Specific medicines may be either safe or unsafe to take during pregnancy.  Take a prenatal vitamin that contains at least 600 micrograms (mcg) of folic acid.  If you develop constipation, try taking a stool softener if your health care provider approves. Eating and drinking   Eat a balanced diet that includes fresh fruits and vegetables, whole grains, good sources of protein such as meat, eggs, or tofu,  and low-fat dairy. Your health care provider will help you determine the amount of weight gain that is right for you.  Avoid raw meat and uncooked cheese. These carry germs that can cause birth defects in the baby.  If you have low calcium intake from food, talk to your health care provider about whether you should take a daily calcium supplement.  Eat four or five small meals rather than three large meals a day.  Limit foods that are high in fat and processed sugars, such as fried and sweet foods.  To prevent constipation: ? Drink enough fluid to keep your urine clear or pale yellow. ? Eat foods that are high in fiber, such as fresh fruits and vegetables, whole grains, and beans. Activity  Exercise only as directed by your health care provider. Most women can continue their usual exercise routine during pregnancy. Try to exercise for 30 minutes at least 5 days a week. Stop exercising if you experience uterine contractions.  Avoid heavy lifting.  Do   not exercise in extreme heat or humidity, or at high altitudes.  Wear low-heel, comfortable shoes.  Practice good posture.  You may continue to have sex unless your health care provider tells you otherwise. Relieving pain and discomfort  Take frequent breaks and rest with your legs elevated if you have leg cramps or low back pain.  Take warm sitz baths to soothe any pain or discomfort caused by hemorrhoids. Use hemorrhoid cream if your health care provider approves.  Wear a good support bra to prevent discomfort from breast tenderness.  If you develop varicose veins: ? Wear support pantyhose or compression stockings as told by your healthcare provider. ? Elevate your feet for 15 minutes, 3-4 times a day. Prenatal care  Write down your questions. Take them to your prenatal visits.  Keep all your prenatal visits as told by your health care provider. This is important. Safety  Wear your seat belt at all times when driving.  Make  a list of emergency phone numbers, including numbers for family, friends, the hospital, and police and fire departments. General instructions  Avoid cat litter boxes and soil used by cats. These carry germs that can cause birth defects in the baby. If you have a cat, ask someone to clean the litter box for you.  Do not travel far distances unless it is absolutely necessary and only with the approval of your health care provider.  Do not use hot tubs, steam rooms, or saunas.  Do not drink alcohol.  Do not use any products that contain nicotine or tobacco, such as cigarettes and e-cigarettes. If you need help quitting, ask your health care provider.  Do not use any medicinal herbs or unprescribed drugs. These chemicals affect the formation and growth of the baby.  Do not douche or use tampons or scented sanitary pads.  Do not cross your legs for long periods of time.  To prepare for the arrival of your baby: ? Take prenatal classes to understand, practice, and ask questions about labor and delivery. ? Make a trial run to the hospital. ? Visit the hospital and tour the maternity area. ? Arrange for maternity or paternity leave through employers. ? Arrange for family and friends to take care of pets while you are in the hospital. ? Purchase a rear-facing car seat and make sure you know how to install it in your car. ? Pack your hospital bag. ? Prepare the baby's nursery. Make sure to remove all pillows and stuffed animals from the baby's crib to prevent suffocation.  Visit your dentist if you have not gone during your pregnancy. Use a soft toothbrush to brush your teeth and be gentle when you floss. Contact a health care provider if:  You are unsure if you are in labor or if your water has broken.  You become dizzy.  You have mild pelvic cramps, pelvic pressure, or nagging pain in your abdominal area.  You have lower back pain.  You have persistent nausea, vomiting, or  diarrhea.  You have an unusual or bad smelling vaginal discharge.  You have pain when you urinate. Get help right away if:  Your water breaks before 37 weeks.  You have regular contractions less than 5 minutes apart before 37 weeks.  You have a fever.  You are leaking fluid from your vagina.  You have spotting or bleeding from your vagina.  You have severe abdominal pain or cramping.  You have rapid weight loss or weight gain.  You have   shortness of breath with chest pain.  You notice sudden or extreme swelling of your face, hands, ankles, feet, or legs.  Your baby makes fewer than 10 movements in 2 hours.  You have severe headaches that do not go away when you take medicine.  You have vision changes. Summary  The third trimester is from week 28 through week 40, months 7 through 9. The third trimester is a time when the unborn baby (fetus) is growing rapidly.  During the third trimester, your discomfort may increase as you and your baby continue to gain weight. You may have abdominal, leg, and back pain, sleeping problems, and an increased need to urinate.  During the third trimester your breasts will keep growing and they will continue to become tender. A yellow fluid (colostrum) may leak from your breasts. This is the first milk you are producing for your baby.  False labor is a condition in which you feel small, irregular tightenings of the muscles in the womb (contractions) that eventually go away. These are called Braxton Hicks contractions. Contractions may last for hours, days, or even weeks before true labor sets in.  Signs of labor can include: abdominal cramps; regular contractions that start at 10 minutes apart and become stronger and more frequent with time; watery or bloody mucus discharge that comes from the vagina; increased pelvic pressure and dull back pain; and leaking of amniotic fluid. This information is not intended to replace advice given to you by your  health care provider. Make sure you discuss any questions you have with your health care provider. Document Revised: 07/18/2018 Document Reviewed: 05/02/2016 Elsevier Patient Education  2020 [employer]  

## 2019-09-02 NOTE — Progress Notes (Signed)
ROB   CC: None    

## 2019-09-02 NOTE — Progress Notes (Signed)
   PRENATAL VISIT NOTE  Subjective:  Kaitlin [name] is a 29 y.o. [id number] at [redacted]w[redacted]d being seen today for ongoing prenatal care.  She is currently monitored for the following issues for this high-risk pregnancy and has Supervision of high risk pregnancy, antepartum; History of preterm delivery, currently pregnant; LGSIL on Pap smear of cervix; and Migraine on their problem list.  Patient reports occasional contractions.  Contractions: Not present. Vag. Bleeding: None.  Movement: Present. Denies leaking of fluid.   The following portions of the patient's history were reviewed and updated as appropriate: allergies, current medications, past family history, past medical history, past social history, past surgical history and problem list.   Objective:   Vitals:   09/02/19 0930  BP: 127/84  Pulse: (!) 107  Weight: 192 lb (87.1 kg)    Fetal Status: Fetal Heart Rate (bpm): 154 Fundal Height: 29 cm Movement: Present     General:  Alert, oriented and cooperative. Patient is in no acute distress.  Skin: Skin is warm and dry. No rash noted.   Cardiovascular: Normal heart rate noted  Respiratory: Normal respiratory effort, no problems with respiration noted  Abdomen: Soft, gravid, appropriate for gestational age.  Pain/Pressure: Present     Pelvic: Cervical exam deferred        Extremities: Normal range of motion.  Edema: None  Mental Status: Normal mood and affect. Normal behavior. Normal judgment and thought content.   Assessment and Plan:  Pregnancy: [id number] at [redacted]w[redacted]d 1. Supervision of high risk pregnancy, antepartum --Anticipatory guidance about next visits/weeks of pregnancy given. --Next visit in 4 weeks, in person  - Glucose Tolerance, 2 Hours w/1 Hour - CBC - RPR - HIV Antibody (routine testing w rflx)  2. Elevated hemoglobin A1c --A1C of 7.0 on early screening.  Pt reports GDM with previous pregnancy, diet controlled. Hx large babies, 8, 9, and 10 lbs vaginally. - Glucose  Tolerance, 2 Hours w/1 Hour  Preterm labor symptoms and general obstetric precautions including but not limited to vaginal bleeding, contractions, leaking of fluid and fetal movement were reviewed in detail with the patient. Please refer to After Visit Summary for other counseling recommendations.   Return in about 4 weeks (around 09/30/2019).  Future Appointments  Date Time Provider Department Center  09/12/2019  9:45 AM [facility] NURSE [facility]  09/12/2019  9:45 AM [facility] US5 [facility]    Sharen Counter, CNM

## 2019-09-03 ENCOUNTER — Other Ambulatory Visit: Payer: Self-pay | Admitting: Advanced Practice Midwife

## 2019-09-03 ENCOUNTER — Telehealth: Payer: Self-pay | Admitting: Advanced Practice Midwife

## 2019-09-03 ENCOUNTER — Encounter: Payer: Self-pay | Admitting: Advanced Practice Midwife

## 2019-09-03 DIAGNOSIS — O24415 Gestational diabetes mellitus in pregnancy, controlled by oral hypoglycemic drugs: Secondary | ICD-10-CM

## 2019-09-03 DIAGNOSIS — Z8632 Personal history of gestational diabetes: Secondary | ICD-10-CM | POA: Insufficient documentation

## 2019-09-03 DIAGNOSIS — O24419 Gestational diabetes mellitus in pregnancy, unspecified control: Secondary | ICD-10-CM

## 2019-09-03 LAB — CBC
Hematocrit: 33.8 % — ABNORMAL LOW (ref 34.0–46.6)
Hemoglobin: 10.2 g/dL — ABNORMAL LOW (ref 11.1–15.9)
MCH: 21.1 pg — ABNORMAL LOW (ref 26.6–33.0)
MCHC: 30.2 g/dL — ABNORMAL LOW (ref 31.5–35.7)
MCV: 70 fL — ABNORMAL LOW (ref 79–97)
Platelets: 200 10*3/uL (ref 150–450)
RBC: 4.83 x10E6/uL (ref 3.77–5.28)
RDW: 17.2 % — ABNORMAL HIGH (ref 11.7–15.4)
WBC: 6.4 10*3/uL (ref 3.4–10.8)

## 2019-09-03 LAB — GLUCOSE TOLERANCE, 2 HOURS W/ 1HR
Glucose, 1 hour: 316 mg/dL — ABNORMAL HIGH (ref 65–179)
Glucose, 2 hour: 280 mg/dL — ABNORMAL HIGH (ref 65–152)
Glucose, Fasting: 180 mg/dL — ABNORMAL HIGH (ref 65–91)

## 2019-09-03 LAB — HIV ANTIBODY (ROUTINE TESTING W REFLEX): HIV Screen 4th Generation wRfx: NONREACTIVE

## 2019-09-03 LAB — RPR: RPR Ser Ql: NONREACTIVE

## 2019-09-03 MED ORDER — METFORMIN HCL 500 MG PO TABS: 500.0000 mg | ORAL_TABLET | Freq: Two times a day (BID) | ORAL | 2 refills | Status: DC

## 2019-09-03 NOTE — Telephone Encounter (Signed)
Called patient to review abnormal glucose values on 2 hour GTT. Given elevated A1C earlier in pregnancy and grossly elevated values on all 3 measures of GTT, likely preexisting DM.  Start pt on Metformin 500 mg BID today, with referral to diabetes education ASAP.  Pt to f/u in 2 weeks with MD for further glucose management.  Pt to follow ADA diet, discussed briefly on the phone today.  Discussed with pt need to retest for DM postpartum.  Pt states understanding.

## 2019-09-12 ENCOUNTER — Ambulatory Visit: Payer: Medicaid Other

## 2019-09-17 ENCOUNTER — Encounter: Payer: Medicaid Other | Attending: Obstetrics | Admitting: Registered"

## 2019-09-30 ENCOUNTER — Encounter: Payer: Medicaid Other | Admitting: Advanced Practice Midwife

## 2019-09-30 ENCOUNTER — Encounter: Payer: Medicaid Other | Admitting: Obstetrics and Gynecology

## 2019-10-01 ENCOUNTER — Other Ambulatory Visit: Payer: Self-pay

## 2019-10-01 ENCOUNTER — Other Ambulatory Visit: Payer: Self-pay | Admitting: Maternal & Fetal Medicine

## 2019-10-01 ENCOUNTER — Ambulatory Visit: Payer: Medicaid Other | Attending: Maternal & Fetal Medicine

## 2019-10-01 ENCOUNTER — Ambulatory Visit: Payer: Medicaid Other | Admitting: *Deleted

## 2019-10-01 VITALS — BP 129/87 | HR 104

## 2019-10-01 DIAGNOSIS — O24419 Gestational diabetes mellitus in pregnancy, unspecified control: Secondary | ICD-10-CM | POA: Diagnosis not present

## 2019-10-01 DIAGNOSIS — O09213 Supervision of pregnancy with history of pre-term labor, third trimester: Secondary | ICD-10-CM

## 2019-10-01 DIAGNOSIS — O24415 Gestational diabetes mellitus in pregnancy, controlled by oral hypoglycemic drugs: Secondary | ICD-10-CM | POA: Diagnosis not present

## 2019-10-01 DIAGNOSIS — O43193 Other malformation of placenta, third trimester: Secondary | ICD-10-CM | POA: Diagnosis not present

## 2019-10-01 DIAGNOSIS — O43199 Other malformation of placenta, unspecified trimester: Secondary | ICD-10-CM | POA: Insufficient documentation

## 2019-10-01 DIAGNOSIS — Z3A32 32 weeks gestation of pregnancy: Secondary | ICD-10-CM | POA: Diagnosis not present

## 2019-10-01 DIAGNOSIS — E668 Other obesity: Secondary | ICD-10-CM | POA: Diagnosis not present

## 2019-10-01 DIAGNOSIS — O99213 Obesity complicating pregnancy, third trimester: Secondary | ICD-10-CM | POA: Diagnosis not present

## 2019-10-01 DIAGNOSIS — O365930 Maternal care for other known or suspected poor fetal growth, third trimester, not applicable or unspecified: Secondary | ICD-10-CM

## 2019-10-02 ENCOUNTER — Other Ambulatory Visit: Payer: Self-pay | Admitting: *Deleted

## 2019-10-02 DIAGNOSIS — O24415 Gestational diabetes mellitus in pregnancy, controlled by oral hypoglycemic drugs: Secondary | ICD-10-CM

## 2019-10-06 ENCOUNTER — Encounter: Payer: Medicaid Other | Admitting: Obstetrics and Gynecology

## 2019-10-08 ENCOUNTER — Other Ambulatory Visit: Payer: Medicaid Other

## 2019-10-08 ENCOUNTER — Ambulatory Visit: Payer: Medicaid Other

## 2019-10-10 ENCOUNTER — Other Ambulatory Visit: Payer: Self-pay

## 2019-10-10 ENCOUNTER — Ambulatory Visit (INDEPENDENT_AMBULATORY_CARE_PROVIDER_SITE_OTHER): Payer: Medicaid Other | Admitting: Obstetrics and Gynecology

## 2019-10-10 ENCOUNTER — Ambulatory Visit: Payer: Medicaid Other | Admitting: *Deleted

## 2019-10-10 ENCOUNTER — Encounter: Payer: Self-pay | Admitting: Obstetrics and Gynecology

## 2019-10-10 ENCOUNTER — Ambulatory Visit: Payer: Medicaid Other | Attending: Maternal & Fetal Medicine

## 2019-10-10 VITALS — BP 121/84 | HR 114 | Wt 195.0 lb

## 2019-10-10 DIAGNOSIS — Z3A34 34 weeks gestation of pregnancy: Secondary | ICD-10-CM

## 2019-10-10 DIAGNOSIS — O09213 Supervision of pregnancy with history of pre-term labor, third trimester: Secondary | ICD-10-CM

## 2019-10-10 DIAGNOSIS — O24415 Gestational diabetes mellitus in pregnancy, controlled by oral hypoglycemic drugs: Secondary | ICD-10-CM

## 2019-10-10 DIAGNOSIS — O365930 Maternal care for other known or suspected poor fetal growth, third trimester, not applicable or unspecified: Secondary | ICD-10-CM | POA: Diagnosis not present

## 2019-10-10 DIAGNOSIS — E669 Obesity, unspecified: Secondary | ICD-10-CM

## 2019-10-10 DIAGNOSIS — O43193 Other malformation of placenta, third trimester: Secondary | ICD-10-CM | POA: Diagnosis not present

## 2019-10-10 DIAGNOSIS — O0990 Supervision of high risk pregnancy, unspecified, unspecified trimester: Secondary | ICD-10-CM

## 2019-10-10 DIAGNOSIS — O99213 Obesity complicating pregnancy, third trimester: Secondary | ICD-10-CM

## 2019-10-10 MED ORDER — GLUCOSE BLOOD VI STRP: ORAL_STRIP | 12 refills | Status: DC

## 2019-10-10 MED ORDER — BLOOD PRESSURE KIT DEVI: 1.0000 | 0 refills | Status: DC | PRN

## 2019-10-10 NOTE — Progress Notes (Signed)
   PRENATAL VISIT NOTE  Subjective:  Kaitlin [name] is a 29 y.o. [id number] at [redacted]w[redacted]d being seen today for ongoing prenatal care.  She is currently monitored for the following issues for this high-risk pregnancy and has Supervision of high risk pregnancy, antepartum; History of preterm delivery, currently pregnant; LGSIL on Pap smear of cervix; Migraine; and Gestational diabetes mellitus (GDM) controlled on oral hypoglycemic drug on their problem list.  Patient reports no complaints.  Contractions: Irritability. Vag. Bleeding: None.  Movement: Present. Denies leaking of fluid.   The following portions of the patient's history were reviewed and updated as appropriate: allergies, current medications, past family history, past medical history, past social history, past surgical history and problem list.   Objective:   Vitals:   10/10/19 0902 10/10/19 0903  BP: (!) 150/92 121/84  Pulse: (!) 107 (!) 114  Weight: 195 lb (88.5 kg)     Fetal Status: Fetal Heart Rate (bpm): 152   Movement: Present     General:  Alert, oriented and cooperative. Patient is in no acute distress.  Skin: Skin is warm and dry. No rash noted.   Cardiovascular: Normal heart rate noted  Respiratory: Normal respiratory effort, no problems with respiration noted  Abdomen: Soft, gravid, appropriate for gestational age.  Pain/Pressure: Absent     Pelvic: Cervical exam deferred        Extremities: Normal range of motion.  Edema: Trace  Mental Status: Normal mood and affect. Normal behavior. Normal judgment and thought content.   Assessment and Plan:  Pregnancy: [id number] at [redacted]w[redacted]d 1. Supervision of high risk pregnancy, antepartum Patient is doing well without complaints Monitor BP closely Cultures next visit  2. Gestational diabetes mellitus (GDM) in third trimester controlled on oral hypoglycemic drug Patient is not checking CBGs as she does not believe she is diabetic Discussed meaning of GDM and consequence of poorly  controlled GDM Patient agrees to meet virtually with diabetic educator.  Ultrasound today Patient requested to have new meter sent to pharmacy  Preterm labor symptoms and general obstetric precautions including but not limited to vaginal bleeding, contractions, leaking of fluid and fetal movement were reviewed in detail with the patient. Please refer to After Visit Summary for other counseling recommendations.   Return in about 2 weeks (around 10/24/2019) for in person, ROB, High risk.  Future Appointments  Date Time Provider Department Center  10/10/2019  4:00 PM [facility] NURSE [facility]  10/10/2019  4:00 PM [facility] US1 [facility]  10/16/2019  4:00 PM [facility]URSE [facility]  10/16/2019  4:00 PM [facility] US1 [facility]  10/22/2019  3:15 PM [facility] NURSE [facility]  10/22/2019  3:15 PM [facility] US2 [facility]  10/29/2019  3:15 PM [facility] NURSE [facility]  10/29/2019  3:15 PM [facility] US3 [facility]    Catalina Antigua, MD

## 2019-10-10 NOTE — Progress Notes (Signed)
ROB c/o blurry vision and auroras.

## 2019-10-14 ENCOUNTER — Telehealth: Payer: Self-pay

## 2019-10-14 NOTE — Telephone Encounter (Signed)
Returned call, pt states that she is having some vaginal irritation and burning, informed scheduler and advised that someone will call for appt, pt agreed.

## 2019-10-15 ENCOUNTER — Other Ambulatory Visit: Payer: Self-pay

## 2019-10-15 ENCOUNTER — Encounter: Payer: Self-pay | Admitting: Obstetrics

## 2019-10-15 ENCOUNTER — Ambulatory Visit (INDEPENDENT_AMBULATORY_CARE_PROVIDER_SITE_OTHER): Payer: Medicaid Other | Admitting: Obstetrics

## 2019-10-15 VITALS — BP 131/88 | HR 118 | Wt 194.0 lb

## 2019-10-15 DIAGNOSIS — O0993 Supervision of high risk pregnancy, unspecified, third trimester: Secondary | ICD-10-CM

## 2019-10-15 DIAGNOSIS — O24419 Gestational diabetes mellitus in pregnancy, unspecified control: Secondary | ICD-10-CM

## 2019-10-15 DIAGNOSIS — R399 Unspecified symptoms and signs involving the genitourinary system: Secondary | ICD-10-CM | POA: Diagnosis not present

## 2019-10-15 DIAGNOSIS — O0990 Supervision of high risk pregnancy, unspecified, unspecified trimester: Secondary | ICD-10-CM

## 2019-10-15 DIAGNOSIS — Z3A34 34 weeks gestation of pregnancy: Secondary | ICD-10-CM

## 2019-10-15 DIAGNOSIS — O24415 Gestational diabetes mellitus in pregnancy, controlled by oral hypoglycemic drugs: Secondary | ICD-10-CM

## 2019-10-15 LAB — POCT URINALYSIS DIPSTICK
Bilirubin, UA: NEGATIVE
Glucose, UA: POSITIVE — AB
Ketones, UA: NEGATIVE
Nitrite, UA: NEGATIVE
Protein, UA: POSITIVE — AB
Spec Grav, UA: 1.025 (ref 1.010–1.025)
Urobilinogen, UA: 0.2 E.U./dL
pH, UA: 6 (ref 5.0–8.0)

## 2019-10-15 MED ORDER — NITROFURANTOIN MONOHYD MACRO 100 MG PO CAPS: 100.0000 mg | ORAL_CAPSULE | Freq: Two times a day (BID) | ORAL | 2 refills | Status: DC

## 2019-10-15 MED ORDER — ACCU-CHEK SOFTCLIX LANCETS MISC: 12 refills | Status: DC

## 2019-10-15 MED ORDER — ACCU-CHEK GUIDE W/DEVICE KIT: 1.0000 | PACK | Freq: Four times a day (QID) | 0 refills | Status: DC

## 2019-10-15 NOTE — Progress Notes (Signed)
Subjective:  Kaitlin [name] is a 29 y.o. [id number] at 62w6dbeing seen today for ongoing prenatal care.  She is currently monitored for the following issues for this high-risk pregnancy and has Supervision of high risk pregnancy, antepartum; History of preterm delivery, currently pregnant; LGSIL on Pap smear of cervix; Migraine; and Gestational diabetes mellitus (GDM) controlled on oral hypoglycemic drug on their problem list.  Patient reports burning with urination.   .  .   . Denies leaking of fluid.   The following portions of the patient's history were reviewed and updated as appropriate: allergies, current medications, past family history, past medical history, past social history, past surgical history and problem list. Problem list updated.  Objective:   Vitals:   10/15/19 0823  BP: 131/88  Pulse: (!) 118  Weight: 194 lb (88 kg)    Fetal Status:           General:  Alert, oriented and cooperative. Patient is in no acute distress.  Skin: Skin is warm and dry. No rash noted.   Cardiovascular: Normal heart rate noted  Respiratory: Normal respiratory effort, no problems with respiration noted  Abdomen: Soft, gravid, appropriate for gestational age.       Pelvic:  Cervical exam deferred        Extremities: Normal range of motion.     Mental Status: Normal mood and affect. Normal behavior. Normal judgment and thought content.   Urinalysis:      Assessment and Plan:  Pregnancy: [id number] 318w6d1. Supervision of high risk pregnancy, antepartum Rx: - Blood Glucose Monitoring Suppl (ACCU-CHEK GUIDE) w/Device KIT; 1 Device by Does not apply route in the morning, at noon, in the evening, and at bedtime.  Dispense: 1 kit; Refill: 0 - Accu-Chek Softclix Lancets lancets; Use 1 lancet to check blood sugar 4 times daily  Dispense: 100 each; Refill: 12 - POCT urinalysis dipstick - Culture, OB Urine  2. Gestational diabetes mellitus (GDM) in third trimester, gestational diabetes method of  control unspecified Rx: - Blood Glucose Monitoring Suppl (ACCU-CHEK GUIDE) w/Device KIT; 1 Device by Does not apply route in the morning, at noon, in the evening, and at bedtime.  Dispense: 1 kit; Refill: 0 - Accu-Chek Softclix Lancets lancets; Use 1 lancet to check blood sugar 4 times daily  Dispense: 100 each; Refill: 12  3. UTI symptoms Rx - POCT urinalysis dipstick - Culture, OB Urine - nitrofurantoin, macrocrystal-monohydrate, (MACROBID) 100 MG capsule; Take 1 capsule (100 mg total) by mouth 2 (two) times daily. 1 po BID x 7days  Dispense: 14 capsule; Refill: 2  Preterm labor symptoms and general obstetric precautions including but not limited to vaginal bleeding, contractions, leaking of fluid and fetal movement were reviewed in detail with the patient. Please refer to After Visit Summary for other counseling recommendations.   [facility] (around [facility]22/2019[facility]  HaShelly BombardMD  10/15/19

## 2019-10-15 NOTE — Progress Notes (Signed)
Pt states she is having pain with urination.  Pt denies any change in vaginal d/c or irritation.

## 2019-10-16 ENCOUNTER — Ambulatory Visit: Payer: Medicaid Other | Attending: Obstetrics and Gynecology

## 2019-10-16 ENCOUNTER — Ambulatory Visit: Payer: Medicaid Other | Admitting: *Deleted

## 2019-10-16 DIAGNOSIS — O365930 Maternal care for other known or suspected poor fetal growth, third trimester, not applicable or unspecified: Secondary | ICD-10-CM | POA: Diagnosis not present

## 2019-10-16 DIAGNOSIS — O99213 Obesity complicating pregnancy, third trimester: Secondary | ICD-10-CM

## 2019-10-16 DIAGNOSIS — O24415 Gestational diabetes mellitus in pregnancy, controlled by oral hypoglycemic drugs: Secondary | ICD-10-CM

## 2019-10-16 DIAGNOSIS — Z3A35 35 weeks gestation of pregnancy: Secondary | ICD-10-CM | POA: Diagnosis not present

## 2019-10-16 DIAGNOSIS — O43193 Other malformation of placenta, third trimester: Secondary | ICD-10-CM | POA: Diagnosis not present

## 2019-10-16 DIAGNOSIS — E669 Obesity, unspecified: Secondary | ICD-10-CM | POA: Diagnosis not present

## 2019-10-16 DIAGNOSIS — O09213 Supervision of pregnancy with history of pre-term labor, third trimester: Secondary | ICD-10-CM

## 2019-10-17 LAB — CULTURE, OB URINE

## 2019-10-17 LAB — URINE CULTURE, OB REFLEX

## 2019-10-18 ENCOUNTER — Other Ambulatory Visit: Payer: Self-pay

## 2019-10-18 ENCOUNTER — Inpatient Hospital Stay (HOSPITAL_COMMUNITY)
Admission: AD | Admit: 2019-10-18 | Discharge: 2019-10-18 | Disposition: A | Discharge status: Home / Self Care | Payer: Medicaid Other | Attending: Family Medicine | Admitting: Family Medicine

## 2019-10-18 ENCOUNTER — Encounter (HOSPITAL_COMMUNITY): Payer: Self-pay | Admitting: Family Medicine

## 2019-10-18 DIAGNOSIS — O26893 Other specified pregnancy related conditions, third trimester: Secondary | ICD-10-CM

## 2019-10-18 DIAGNOSIS — O99891 Other specified diseases and conditions complicating pregnancy: Secondary | ICD-10-CM | POA: Insufficient documentation

## 2019-10-18 DIAGNOSIS — Z7984 Long term (current) use of oral hypoglycemic drugs: Secondary | ICD-10-CM | POA: Diagnosis not present

## 2019-10-18 DIAGNOSIS — M5430 Sciatica, unspecified side: Secondary | ICD-10-CM

## 2019-10-18 DIAGNOSIS — R102 Pelvic and perineal pain: Secondary | ICD-10-CM | POA: Insufficient documentation

## 2019-10-18 DIAGNOSIS — Z79899 Other long term (current) drug therapy: Secondary | ICD-10-CM | POA: Diagnosis not present

## 2019-10-18 DIAGNOSIS — Z3A35 35 weeks gestation of pregnancy: Secondary | ICD-10-CM | POA: Insufficient documentation

## 2019-10-18 DIAGNOSIS — O24415 Gestational diabetes mellitus in pregnancy, controlled by oral hypoglycemic drugs: Secondary | ICD-10-CM | POA: Diagnosis not present

## 2019-10-18 DIAGNOSIS — N9489 Other specified conditions associated with female genital organs and menstrual cycle: Secondary | ICD-10-CM

## 2019-10-18 LAB — URINALYSIS, ROUTINE W REFLEX MICROSCOPIC
Bilirubin Urine: NEGATIVE
Glucose, UA: 50 mg/dL — AB
Ketones, ur: NEGATIVE mg/dL
Nitrite: NEGATIVE
Protein, ur: NEGATIVE mg/dL
Specific Gravity, Urine: 1.009 (ref 1.005–1.030)
pH: 6 (ref 5.0–8.0)

## 2019-10-18 LAB — GLUCOSE, CAPILLARY: Glucose-Capillary: 125 mg/dL — ABNORMAL HIGH (ref 70–99)

## 2019-10-18 MED ORDER — COMFORT FIT MATERNITY SUPP LG MISC
1.0000 [IU] | Freq: Every day | 0 refills | Status: DC
Start: 2019-10-18 — End: 2019-11-01

## 2019-10-18 MED ORDER — CYCLOBENZAPRINE HCL 5 MG PO TABS
10.0000 mg | ORAL_TABLET | Freq: Once | ORAL | Status: AC
  Administered 2019-10-18: 10 mg via ORAL
  Filled 2019-10-18: qty 2

## 2019-10-18 NOTE — MAU Provider Note (Signed)
History     CSN: [id number]  Arrival date and time: 10/18/19 5885   First Provider Initiated Contact with Patient 10/18/19 [phone]      Chief Complaint  Patient presents with  . Abdominal Pain  . Pelvic Pain   Kaitlin [name] is a 29 y.o. G5P4 at 42w2dwho presents to MAU with complaints of pelvic pressure and back pain.  Patient reports symptoms have been occurring since Tuesday, but got worse last night when she was unable to sleep.  Patient reports a lot of pelvic pressure and back pain.  She the pain shoots down her back into her leg on the back of her thigh.  Rated pain 8 out of 10 last night but reports pain is down to 2, feeling more pressure than pain.  Denies vaginal bleeding, leaking of fluid, vaginal discharge.  Reports good fetal movement.     OB History    Gravida  5   Para  4   Term  3   Preterm  1   AB      Living  4     SAB      TAB      Ectopic      Multiple      Live Births  4           Past Medical History:  Diagnosis Date  . Gestational diabetes     Past Surgical History:  Procedure Laterality Date  . [place]    History reviewed. No pertinent family history.  Social History   Tobacco Use  . Smoking status: Never Smoker  . Smokeless tobacco: Never Used  Vaping Use  . Vaping Use: Never used  Substance Use Topics  . Alcohol use: Never  . Drug use: Never    Allergies: No Known Allergies  Medications Prior to Admission  Medication Sig Dispense Refill Last Dose  . Accu-Chek Softclix Lancets lancets Use 1 lancet to check blood sugar 4 times daily 100 each 12 Past Week at Unknown time  . Blood Glucose Monitoring Suppl (ACCU-CHEK GUIDE) w/Device KIT 1 Device by Does not apply route in the morning, at noon, in the evening, and at bedtime. 1 kit 0 Past Week at Unknown time  . Blood Pressure Monitoring (BLOOD PRESSURE KIT) DEVI 1 Device by Does not apply route as needed. Check Blood Pressure regularly and record  readings into the Babys[employer] App.  Large Cuff.  DX O90.0 1 each 0 Past Week at Unknown time  . Doxylamine-Pyridoxine (DICLEGIS) 10-10 MG TBEC Take 2 tabs qhs then add 1 tab A.M and midday prn 90 tablet 6 10/17/2019 at Unknown time  . ferrous sulfate 325 (65 FE) MG tablet Take 1 tablet (325 mg total) by mouth 2 (two) times daily with a meal. 60 tablet 5 10/17/2019 at Unknown time  . metroNIDAZOLE (FLAGYL) 500 MG tablet Take 1 tablet (500 mg total) by mouth 2 (two) times daily. 14 tablet 0 10/17/2019 at Unknown time  . nitrofurantoin, macrocrystal-monohydrate, (MACROBID) 100 MG capsule Take 1 capsule (100 mg total) by mouth 2 (two) times daily. 1 po BID x 7days 14 capsule 2 10/17/2019 at Unknown time  . pantoprazole (PROTONIX) 20 MG tablet Take 1 tablet (20 mg total) by mouth daily. 30 tablet 1 10/17/2019 at Unknown time  . Prenatal Vit-Fe Fumarate-FA (PREPLUS) 27-1 MG TABS Take 1 tablet by mouth daily. 30 tablet 13 10/17/2019 at Unknown time  . promethazine (PHENERGAN) 25 MG tablet Take 1  tablet (25 mg total) by mouth every 6 (six) hours as needed for nausea or vomiting. 30 tablet 2 10/17/2019 at Unknown time  . clindamycin (CLEOCIN) 300 MG capsule Take 1 capsule (300 mg total) by mouth 3 (three) times daily. (Patient not taking: Reported on 05/21/2019) 21 capsule 0   . glucose blood test strip Use as instructed 100 each 12   . metFORMIN (GLUCOPHAGE) 500 MG tablet Take 1 tablet (500 mg total) by mouth 2 (two) times daily with a meal. (Patient not taking: Reported on 10/01/2019) 60 tablet 2     Review of Systems  Constitutional: Negative.   Respiratory: Negative.   Cardiovascular: Negative.   Gastrointestinal: Negative.   Genitourinary: Negative for difficulty urinating, dysuria, frequency, urgency, vaginal bleeding and vaginal discharge.       Pelvic pressure  Musculoskeletal: Positive for back pain.       Sciatic nerve pain  Neurological: Negative.   Psychiatric/Behavioral: Negative.    Physical Exam    Blood pressure 132/77, pulse (!) 111, temperature 98.7 F (37.1 C), temperature source Oral, weight 88.2 kg, last menstrual period 01/26/2019.  Physical Exam Vitals and nursing note reviewed.  Cardiovascular:     Rate and Rhythm: Normal rate and regular rhythm.  Pulmonary:     Effort: Pulmonary effort is normal. No respiratory distress.     Breath sounds: Normal breath sounds. No wheezing.  Abdominal:     Palpations: Abdomen is soft.     Comments: Gravid appropriate for gestational age   Skin:    General: Skin is warm and dry.  Neurological:     Mental Status: She is alert and oriented to person, place, and time.  Psychiatric:        Mood and Affect: Mood normal.        Behavior: Behavior normal.        Thought Content: Thought content normal.    Dilation: 2.5 Effacement (%): 50 Station: -3 Presentation: Vertex Exam by:: Darrol Poke, CNM  Fetal monitoring:  FHR- 150/moderate/+accels/ no decelerations  Toco- UI   MAU Course  Procedures  MDM Meds ordered this encounter  Medications  . cyclobenzaprine (FLEXERIL) tablet 10 mg   Hydration and Flexeril ordered, plan to recheck cervix in 1 hour   Reassessment @ [phone] - patient reports pain, pressure and leg cramps have resolved after medication  Cervix rechecked and unchanged Dilation: 2.5 Effacement (%): 50 Cervical Position: Posterior Station: -3 Presentation: Vertex Exam by:: Wende Bushy CNM  Discussed reasons to return to MAU. Follow up as scheduled in the office. Return to MAU as needed. Pt stable at time of discharge.   Assessment and Plan   1. Back pain with sciatica   2. Pelvic pressure in pregnancy, antepartum, third trimester   3. [redacted] weeks gestation of pregnancy    Discharge home Follow up as scheduled in the office for prenatal care Return to MAU as needed for reasons discussed and/or emergencies  Rx for maternity support belt  Discussed use of magnesium for leg cramps at night and constipation     Follow-up [place] Follow up.   Specialty: Obstetrics and Gynecology Why: Follow up as scheduled for prenatal care Contact information: [address], [place] [phone]             Allergies as of 10/18/2019   No Known Allergies     Medication List    STOP taking  these medications   clindamycin 300 MG capsule Commonly known as: Cleocin   metroNIDAZOLE 500 MG tablet Commonly known as: Flagyl   nitrofurantoin (macrocrystal-monohydrate) 100 MG capsule Commonly known as: MACROBID     TAKE these medications   Accu-Chek Guide w/Device Kit 1 Device by Does not apply route in the morning, at noon, in the evening, and at bedtime.   Accu-Chek Softclix Lancets lancets Use 1 lancet to check blood sugar 4 times daily   Blood Pressure Kit Devi 1 Device by Does not apply route as needed. Check Blood Pressure regularly and record readings into the Babyscripts App.  Large Cuff.  DX O90.0   Comfort Fit Maternity Supp Lg Misc 1 Units by Does not apply route daily.   Doxylamine-Pyridoxine 10-10 MG Tbec Commonly known as: Diclegis Take 2 tabs qhs then add 1 tab A.M and midday prn   ferrous sulfate 325 (65 FE) MG tablet Take 1 tablet (325 mg total) by mouth 2 (two) times daily with a meal.   glucose blood test strip Use as instructed   metFORMIN 500 MG tablet Commonly known as: GLUCOPHAGE Take 1 tablet (500 mg total) by mouth 2 (two) times daily with a meal.   pantoprazole 20 MG tablet Commonly known as: Protonix Take 1 tablet (20 mg total) by mouth daily.   PrePLUS 27-1 MG Tabs Take 1 tablet by mouth daily.   promethazine 25 MG tablet Commonly known as: PHENERGAN Take 1 tablet (25 mg total) by mouth every 6 (six) hours as needed for nausea or vomiting.       Lajean Manes CNM 10/18/2019, 8:14 AM

## 2019-10-18 NOTE — Discharge Instructions (Signed)
For leg cramps and constipation take Magnesium 250mg  OTC supplements once nightly     PREGNANCY SUPPORT BELT: You are not alone, Seventy-five percent of women have some sort of abdominal or back pain at some point in their pregnancy. Your baby is growing at a fast pace, which means that your whole body is rapidly trying to adjust to the changes. As your uterus grows, your back may start feeling a bit under stress and this can result in back or abdominal pain that can go from mild, and therefore bearable, to severe pains that will not allow you to sit or lay down comfortably, When it comes to dealing with pregnancy-related pains and cramps, some pregnant women usually prefer natural remedies, which the market is filled with nowadays. For example, wearing a pregnancy support belt can help ease and lessen your discomfort and pain. WHAT ARE THE BENEFITS OF WEARING A PREGNANCY SUPPORT BELT? A pregnancy support belt provides support to the lower portion of the belly taking some of the weight of the growing uterus and distributing to the other parts of your body. It is designed make you comfortable and gives you extra support. Over the years, the pregnancy apparel market has been studying the needs and wants of pregnant women and they have come up with the most comfortable pregnancy support belts that woman could ever ask for. In fact, you will no longer have to wear a stretched-out or bulky pregnancy belt that is visible underneath your clothes and makes you feel even more uncomfortable. Nowadays, a pregnancy support belt is made of comfortable and stretchy materials that will not irritate your skin but will actually make you feel at ease and you will not even notice you are wearing it. They are easy to put on and adjust during the day and can be worn at night for additional support.  BENEFITS: . Relives Back pain . Relieves Abdominal Muscle and Leg Pain . Stabilizes the Pelvic Ring . Offers a Cushioned  Abdominal Lift Pad . Relieves pressure on the Sciatic Nerve Within Minutes WHERE TO GET YOUR PREGNANCY BELT: [phone] @[phone]  [address] [employer], [place]

## 2019-10-18 NOTE — MAU Note (Signed)
Pt reports to MAU stating she is having pelvic pressure and abdominal cramping that has been ongoing since Tuesday. No bleeding or LOF. +FM.

## 2019-10-19 LAB — CULTURE, OB URINE: Culture: NO GROWTH

## 2019-10-22 ENCOUNTER — Ambulatory Visit: Payer: Medicaid Other

## 2019-10-23 ENCOUNTER — Ambulatory Visit: Payer: Medicaid Other | Admitting: *Deleted

## 2019-10-23 ENCOUNTER — Ambulatory Visit: Payer: Medicaid Other | Attending: Maternal & Fetal Medicine

## 2019-10-23 ENCOUNTER — Other Ambulatory Visit: Payer: Self-pay

## 2019-10-23 VITALS — BP 135/87 | HR 114

## 2019-10-23 DIAGNOSIS — O43193 Other malformation of placenta, third trimester: Secondary | ICD-10-CM

## 2019-10-23 DIAGNOSIS — E669 Obesity, unspecified: Secondary | ICD-10-CM | POA: Diagnosis not present

## 2019-10-23 DIAGNOSIS — O24419 Gestational diabetes mellitus in pregnancy, unspecified control: Secondary | ICD-10-CM | POA: Diagnosis present

## 2019-10-23 DIAGNOSIS — O99213 Obesity complicating pregnancy, third trimester: Secondary | ICD-10-CM | POA: Diagnosis not present

## 2019-10-23 DIAGNOSIS — O24415 Gestational diabetes mellitus in pregnancy, controlled by oral hypoglycemic drugs: Secondary | ICD-10-CM

## 2019-10-23 DIAGNOSIS — O365930 Maternal care for other known or suspected poor fetal growth, third trimester, not applicable or unspecified: Secondary | ICD-10-CM

## 2019-10-23 DIAGNOSIS — O09213 Supervision of pregnancy with history of pre-term labor, third trimester: Secondary | ICD-10-CM | POA: Diagnosis not present

## 2019-10-23 DIAGNOSIS — Z3A36 36 weeks gestation of pregnancy: Secondary | ICD-10-CM

## 2019-10-24 ENCOUNTER — Encounter (HOSPITAL_COMMUNITY): Payer: Self-pay | Admitting: *Deleted

## 2019-10-24 ENCOUNTER — Other Ambulatory Visit (HOSPITAL_COMMUNITY)
Admission: RE | Admit: 2019-10-24 | Discharge: 2019-10-24 | Disposition: A | Discharge status: Home / Self Care | Payer: Medicaid Other | Source: Ambulatory Visit | Attending: Obstetrics and Gynecology | Admitting: Obstetrics and Gynecology

## 2019-10-24 ENCOUNTER — Ambulatory Visit (INDEPENDENT_AMBULATORY_CARE_PROVIDER_SITE_OTHER): Payer: Medicaid Other | Admitting: Obstetrics and Gynecology

## 2019-10-24 ENCOUNTER — Telehealth (HOSPITAL_COMMUNITY): Payer: Self-pay | Admitting: *Deleted

## 2019-10-24 ENCOUNTER — Encounter: Payer: Self-pay | Admitting: Obstetrics and Gynecology

## 2019-10-24 VITALS — BP 125/85 | HR 114 | Wt 194.0 lb

## 2019-10-24 DIAGNOSIS — O0990 Supervision of high risk pregnancy, unspecified, unspecified trimester: Secondary | ICD-10-CM

## 2019-10-24 DIAGNOSIS — O09899 Supervision of other high risk pregnancies, unspecified trimester: Secondary | ICD-10-CM

## 2019-10-24 DIAGNOSIS — O24415 Gestational diabetes mellitus in pregnancy, controlled by oral hypoglycemic drugs: Secondary | ICD-10-CM

## 2019-10-24 DIAGNOSIS — O133 Gestational [pregnancy-induced] hypertension without significant proteinuria, third trimester: Secondary | ICD-10-CM

## 2019-10-24 DIAGNOSIS — Z3A36 36 weeks gestation of pregnancy: Secondary | ICD-10-CM

## 2019-10-24 DIAGNOSIS — Z8759 Personal history of other complications of pregnancy, childbirth and the puerperium: Secondary | ICD-10-CM | POA: Insufficient documentation

## 2019-10-24 DIAGNOSIS — O09213 Supervision of pregnancy with history of pre-term labor, third trimester: Secondary | ICD-10-CM

## 2019-10-24 NOTE — Progress Notes (Signed)
   PRENATAL VISIT NOTE  Subjective:  Kaitlin [name] is a 29 y.o. [id number] at [redacted]w[redacted]d being seen today for ongoing prenatal care.  She is currently monitored for the following issues for this high-risk pregnancy and has Supervision of high risk pregnancy, antepartum; History of preterm delivery, currently pregnant; LGSIL on Pap smear of cervix; Migraine; Gestational diabetes mellitus (GDM) controlled on oral hypoglycemic drug; and Gestational hypertension on their problem list.  Patient reports no complaints.  Contractions: Irregular. Vag. Bleeding: None.  Movement: Present. Denies leaking of fluid.   The following portions of the patient's history were reviewed and updated as appropriate: allergies, current medications, past family history, past medical history, past social history, past surgical history and problem list.   Objective:   Vitals:   10/24/19 1046  BP: 125/85  Pulse: (!) 114  Weight: 194 lb (88 kg)    Fetal Status: Fetal Heart Rate (bpm): 152 Fundal Height: 38 cm Movement: Present     General:  Alert, oriented and cooperative. Patient is in no acute distress.  Skin: Skin is warm and dry. No rash noted.   Cardiovascular: Normal heart rate noted  Respiratory: Normal respiratory effort, no problems with respiration noted  Abdomen: Soft, gravid, appropriate for gestational age.  Pain/Pressure: Present     Pelvic: Cervical exam performed in the presence of a chaperone Dilation: 2.5 Effacement (%): 50 Station: -3  Extremities: Normal range of motion.     Mental Status: Normal mood and affect. Normal behavior. Normal judgment and thought content.   Assessment and Plan:  Pregnancy: E3P2951 at [redacted]w[redacted]d 1. Supervision of high risk pregnancy, antepartum Patient is doing well without complaints Cultures collected today - Cervicovaginal ancillary only( [place]) - Strep Gp B NAA  2. Gestational diabetes mellitus (GDM) in third trimester controlled on oral hypoglycemic drug Patient  did not bring CBG log. She reports fasting as high as 78 and pp as high as 120 She informed MFM yesterday that she was not checking CBGs nor is she willing for inpatient glycemic control  3. History of preterm delivery, currently pregnant   4. Gestational hypertension, third trimester Elevated BP yesterday  Will plan for IOL at 37 weeks  Preterm labor symptoms and general obstetric precautions including but not limited to vaginal bleeding, contractions, leaking of fluid and fetal movement were reviewed in detail with the patient. Please refer to After Visit Summary for other counseling recommendations.   No follow-ups on file.  Future Appointments  Date Time Provider Department Center  10/29/2019  3:15 PM [facility] NURSE [facility]  10/29/2019  3:15 PM [facility] US3 [facility]    Catalina Antigua, MD

## 2019-10-24 NOTE — Telephone Encounter (Signed)
Preadmission screen  

## 2019-10-25 ENCOUNTER — Encounter (HOSPITAL_COMMUNITY): Payer: Self-pay | Admitting: Obstetrics & Gynecology

## 2019-10-25 ENCOUNTER — Inpatient Hospital Stay (HOSPITAL_COMMUNITY)
Admission: AD | Admit: 2019-10-25 | Discharge: 2019-10-25 | Disposition: A | Discharge status: Home / Self Care | Payer: Medicaid Other | Attending: Obstetrics & Gynecology | Admitting: Obstetrics & Gynecology

## 2019-10-25 ENCOUNTER — Other Ambulatory Visit: Payer: Self-pay

## 2019-10-25 ENCOUNTER — Inpatient Hospital Stay (HOSPITAL_BASED_OUTPATIENT_CLINIC_OR_DEPARTMENT_OTHER): Payer: Medicaid Other

## 2019-10-25 DIAGNOSIS — O99213 Obesity complicating pregnancy, third trimester: Secondary | ICD-10-CM | POA: Diagnosis not present

## 2019-10-25 DIAGNOSIS — Z3493 Encounter for supervision of normal pregnancy, unspecified, third trimester: Secondary | ICD-10-CM

## 2019-10-25 DIAGNOSIS — O09293 Supervision of pregnancy with other poor reproductive or obstetric history, third trimester: Secondary | ICD-10-CM | POA: Diagnosis not present

## 2019-10-25 DIAGNOSIS — O24415 Gestational diabetes mellitus in pregnancy, controlled by oral hypoglycemic drugs: Secondary | ICD-10-CM

## 2019-10-25 DIAGNOSIS — O43192 Other malformation of placenta, second trimester: Secondary | ICD-10-CM | POA: Diagnosis not present

## 2019-10-25 DIAGNOSIS — O365930 Maternal care for other known or suspected poor fetal growth, third trimester, not applicable or unspecified: Secondary | ICD-10-CM | POA: Insufficient documentation

## 2019-10-25 DIAGNOSIS — O24419 Gestational diabetes mellitus in pregnancy, unspecified control: Secondary | ICD-10-CM

## 2019-10-25 DIAGNOSIS — O133 Gestational [pregnancy-induced] hypertension without significant proteinuria, third trimester: Secondary | ICD-10-CM

## 2019-10-25 DIAGNOSIS — R109 Unspecified abdominal pain: Secondary | ICD-10-CM | POA: Diagnosis not present

## 2019-10-25 DIAGNOSIS — E669 Obesity, unspecified: Secondary | ICD-10-CM | POA: Insufficient documentation

## 2019-10-25 DIAGNOSIS — M545 Low back pain: Secondary | ICD-10-CM | POA: Diagnosis not present

## 2019-10-25 DIAGNOSIS — O43193 Other malformation of placenta, third trimester: Secondary | ICD-10-CM | POA: Diagnosis not present

## 2019-10-25 DIAGNOSIS — Z3A36 36 weeks gestation of pregnancy: Secondary | ICD-10-CM | POA: Diagnosis not present

## 2019-10-25 DIAGNOSIS — O4703 False labor before 37 completed weeks of gestation, third trimester: Secondary | ICD-10-CM | POA: Diagnosis not present

## 2019-10-25 DIAGNOSIS — O479 False labor, unspecified: Secondary | ICD-10-CM

## 2019-10-25 DIAGNOSIS — O26893 Other specified pregnancy related conditions, third trimester: Secondary | ICD-10-CM | POA: Diagnosis not present

## 2019-10-25 LAB — WET PREP, GENITAL
Clue Cells Wet Prep HPF POC: NONE SEEN
Sperm: NONE SEEN
Trich, Wet Prep: NONE SEEN
Yeast Wet Prep HPF POC: NONE SEEN

## 2019-10-25 LAB — GLUCOSE, CAPILLARY: Glucose-Capillary: 166 mg/dL — ABNORMAL HIGH (ref 70–99)

## 2019-10-25 LAB — POCT FERN TEST: POCT Fern Test: NEGATIVE

## 2019-10-25 MED ORDER — INSULIN ASPART 100 UNIT/ML ~~LOC~~ SOLN: 2.0000 [IU] | Freq: Once | SUBCUTANEOUS | Status: DC

## 2019-10-25 NOTE — MAU Note (Signed)
Kaitlin [name] is a 29 y.o. at [redacted]w[redacted]d here in MAU reporting: having lower back and abdominal pain since last night, pain is intermittent. This afternoon had some fluid, put a pad on but states she stopped leaking. States it was [place]. No bleeding. +FM  Onset of complaint: last night  Pain score: back 8/10, abdomen 8/10  Vitals:   10/25/19 1628  BP: 136/85  Pulse: (!) 113  Resp: 16  Temp: 98.3 F (36.8 C)  SpO2: 99%     FHT: +FM  Lab orders placed from triage: none

## 2019-10-25 NOTE — MAU Provider Note (Signed)
First Provider Initiated Contact with Patient 10/25/19 1656      S: Ms. Kaitlin [name] is a 29 y.o. [id number] at [redacted]w[redacted]d  who presents to MAU today complaining of leaking of fluid since 1500. Reports that she felt something coming out and then later noticed that it had went her entire pad. Reports a thick [place] discharge for several days as well. No leaking since that time. She is also feeling contractions, initially rating at 8/10 and mostly in her back but reports they have lessened some. She denies vaginal bleeding. She reports normal fetal movement.    O: BP 136/85 (BP Location: Right Arm)   Pulse (!) 113   Temp 98.3 F (36.8 C) (Oral)   Resp 16   Ht 5\' 4"  (1.626 m)   Wt 88.7 kg   LMP 01/26/2019 (Approximate)   SpO2 99% Comment: room air  BMI 33.57 kg/m  GENERAL: Well-developed, well-nourished female in no acute distress.  HEAD: Normocephalic, atraumatic.  CHEST: Normal effort of breathing, mild tachycardia  ABDOMEN: Soft, nontender, gravid PELVIC: Normal external female genitalia. Vagina is pink and rugated. Cervix with normal contour, no lesions. Normal discharge.  No pooling however thick clear/[place] discharge noted.   Cervical exam:  Dilation: 3 Effacement (%): 60 Station: -3 Presentation: Vertex Exam by:: [email or web address], MD Bag of water palpated on exam.   Fetal Monitoring: Baseline: 150 Variability: minimal to moderate initially and then moderate  Accelerations: present but only 10x10's initially and then later 15x15's and reactive tracing  Decelerations: absent Contractions: q5-33m  Results for orders placed or performed during the hospital encounter of 10/25/19 (from the past 24 hour(s))  POCT fern test     Status: None   Collection Time: 10/25/19  5:07 PM  Result Value Ref Range   POCT Fern Test Negative = intact amniotic membranes   Wet prep, genital     Status: Abnormal   Collection Time: 10/25/19  5:08 PM   Specimen: Cervix  Result Value Ref Range   Yeast Wet  Prep HPF POC NONE SEEN NONE SEEN   Trich, Wet Prep NONE SEEN NONE SEEN   Clue Cells Wet Prep HPF POC NONE SEEN NONE SEEN   WBC, Wet Prep HPF POC MANY (A) NONE SEEN   Sperm NONE SEEN   Glucose, capillary     Status: Abnormal   Collection Time: 10/25/19  5:19 PM  Result Value Ref Range   Glucose-Capillary 166 (H) 70 - 99 mg/dL   Comment 1 Document in Chart      A: SIUP at [redacted]w[redacted]d  Membranes intact, Fern negative and no pooling. Bag of water palpated on exam. Patient with IOL next week for poorly-controlled/monitored GDM. POCT glucose: 166 (2 hour post-prandial); 2 units insulin ordered but patient refuses any diabetic medications Cervix essentially unchanged from yesterday GBS pending from yesterday  BPP ordered due to NST and 8/8; NST reactive prior to BPP being done nonetheless Wet prep: WNL Ctx resolved shortly after arrival and cervix unchanged after ~ 2 hours  P: Discharge home with return precautions. Discussed importance of strict glucose control. IOL next week.   Future Appointments  Date Time Provider Department Center  10/28/2019  9:40 AM [facility] None  10/29/2019  3:15 PM [facility] NURSE [facility]  10/29/2019  3:15 PM [facility] US3 [facility]  10/30/2019  7:30 AM [facility] SCHED ROOM [facility] None   11/01/2019, MD 10/25/2019 7:04 PM

## 2019-10-25 NOTE — Discharge Instructions (Signed)
Signs and Symptoms of Labor Labor is your body's natural process of moving your baby, placenta, and umbilical cord out of your uterus. The process of labor usually starts when your baby is full-term, between 37 and 40 weeks of pregnancy. How will I know when I am close to going into labor? As your body prepares for labor and the birth of your baby, you may notice the following symptoms in the weeks and days before true labor starts:  Having a strong desire to get your home ready to receive your new baby. This is called nesting. Nesting may be a sign that labor is approaching, and it may occur several weeks before birth. Nesting may involve cleaning and organizing your home.  Passing a small amount of thick, bloody mucus out of your vagina (normal bloody show or losing your mucus plug). This may happen more than a week before labor begins, or it might occur right before labor begins as the opening of the cervix starts to widen (dilate). For some women, the entire mucus plug passes at once. For others, smaller portions of the mucus plug may gradually pass over several days.  Your baby moving (dropping) lower in your pelvis to get into position for birth (lightening). When this happens, you may feel more pressure on your bladder and pelvic bone and less pressure on your ribs. This may make it easier to breathe. It may also cause you to need to urinate more often and have problems with bowel movements.  Having "practice contractions" (Braxton Hicks contractions) that occur at irregular (unevenly spaced) intervals that are more than 10 minutes apart. This is also called false labor. False labor contractions are common after exercise or sexual activity, and they will stop if you change position, rest, or drink fluids. These contractions are usually mild and do not get stronger over time. They may feel like: ? A backache or back pain. ? Mild cramps, similar to menstrual cramps. ? Tightening or pressure in  your abdomen. Other early symptoms that labor may be starting soon include:  Nausea or loss of appetite.  Diarrhea.  Having a sudden burst of energy, or feeling very tired.  Mood changes.  Having trouble sleeping. How will I know when labor has begun? Signs that true labor has begun may include:  Having contractions that come at regular (evenly spaced) intervals and increase in intensity. This may feel like more intense tightening or pressure in your abdomen that moves to your back. ? Contractions may also feel like rhythmic pain in your upper thighs or back that comes and goes at regular intervals. ? For first-time mothers, this change in intensity of contractions often occurs at a more gradual pace. ? Women who have given birth before may notice a more rapid progression of contraction changes.  Having a feeling of pressure in the vaginal area.  Your water breaking (rupture of membranes). This is when the sac of fluid that surrounds your baby breaks. When this happens, you will notice fluid leaking from your vagina. This may be clear or blood-tinged. Labor usually starts within 24 hours of your water breaking, but it may take longer to begin. ? Some women notice this as a gush of fluid. ? Others notice that their underwear repeatedly becomes damp. Follow these instructions at home:   When labor starts, or if your water breaks, call your health care provider or nurse care line. Based on your situation, they will determine when you should go in for an   exam.  When you are in early labor, you may be able to rest and manage symptoms at home. Some strategies to try at home include: ? Breathing and relaxation techniques. ? Taking a warm bath or shower. ? Listening to music. ? Using a heating pad on the lower back for pain. If you are directed to use heat:  Place a towel between your skin and the heat source.  Leave the heat on for 20-30 minutes.  Remove the heat if your skin turns  bright red. This is especially important if you are unable to feel pain, heat, or cold. You may have a greater risk of getting burned. Get help right away if:  You have painful, regular contractions that are 5 minutes apart or less.  Labor starts before you are [redacted] weeks along in your pregnancy.  You have a fever.  You have a headache that does not go away.  You have bright red blood coming from your vagina.  You do not feel your baby moving.  You have a sudden onset of: ? Severe headache with vision problems. ? Nausea, vomiting, or diarrhea. ? Chest pain or shortness of breath. These symptoms may be an emergency. If your health care provider recommends that you go to the hospital or birth center where you plan to deliver, do not drive yourself. Have someone else drive you, or call emergency services (911 in the [place] Summary  Labor is your body's natural process of moving your baby, placenta, and umbilical cord out of your uterus.  The process of labor usually starts when your baby is full-term, between 97 and 40 weeks of pregnancy.  When labor starts, or if your water breaks, call your health care provider or nurse care line. Based on your situation, they will determine when you should go in for an exam. This information is not intended to replace advice given to you by your health care provider. Make sure you discuss any questions you have with your health care provider. Document Revised: 12/25/2016 Document Reviewed: 09/01/2016 Elsevier Patient Education  2020 [employer]. Gestational Diabetes Mellitus, Diagnosis Gestational diabetes (gestational diabetes mellitus) is a short-term (temporary) form of diabetes that can happen during pregnancy. It goes away after you give birth. It may be caused by one or both of these problems:  Your pancreas does not make enough of a hormone called insulin.  Your body does not respond in a normal way to insulin that it makes. Insulin lets  sugars (glucose) go into cells in the body. This gives you energy. If you have diabetes, sugars cannot get into cells. This causes high blood sugar (hyperglycemia). If you get gestational diabetes, you are:  More likely to get it if you get pregnant again.  More likely to develop type 2 diabetes in the future. If gestational diabetes is treated, it may not hurt you or your baby. Your doctor will set treatment goals for you. In general, you should have these blood sugar levels:  After not eating for a long time (fasting): 95 mg/dL (5.3 mmol/L).  After meals (postprandial): ? One hour after a meal: at or below 140 mg/dL (7.8 mmol/L). ? Two hours after a meal: at or below 120 mg/dL (6.7 mmol/L).  A1c (hemoglobin A1c) level: 6-6.5%. Follow these instructions at home: Questions to ask your doctor   You may want to ask these questions: ? Do I need to meet with a diabetes educator? ? What equipment will I need to care for  myself at home? ? What medicines do I need? When should I take them? ? How often do I need to check my blood sugar? ? What number can I call if I have questions? ? When is my next doctor's visit? General instructions  Take over-the-counter and prescription medicines only as told by your doctor.  Stay at a healthy weight during pregnancy.  Keep all follow-up visits as told by your doctor. This is important. Contact a doctor if:  Your blood sugar is at or above 240 mg/dL (16.9 mmol/L).  Your blood sugar is at or above 200 mg/dL (67.8 mmol/L) and you have ketones in your pee (urine).  You have been sick or have had a fever for 2 days or more and you are not getting better.  You have any of these problems for more than 6 hours: ? You cannot eat or drink. ? You feel sick to your stomach (nauseous). ? You throw up (vomit). ? You have watery poop (diarrhea). Get help right away if:  Your blood sugar is lower than 54 mg/dL (3 mmol/L).  You get confused.  You  have trouble: ? Thinking clearly. ? Breathing.  Your baby moves less than normal.  You have any of these: ? Moderate or large ketone levels in your pee. ? Blood coming from your vagina. ? Unusual fluid coming from your vagina. ? Early contractions. These may feel like tightness in your belly. Summary  Gestational diabetes is a short-term form of diabetes. It can happen while you are pregnant. It goes away after you give birth.  If gestational diabetes is treated, it may not hurt you or your baby. Your doctor will set treatment goals for you.  Keep all follow-up visits as told by your doctor. This is important. This information is not intended to replace advice given to you by your health care provider. Make sure you discuss any questions you have with your health care provider. Document Revised: 05/03/2017 Document Reviewed: 04/30/2015 Elsevier Patient Education  2020 [employer].

## 2019-10-26 LAB — STREP GP B NAA: Strep Gp B NAA: NEGATIVE

## 2019-10-27 LAB — CERVICOVAGINAL ANCILLARY ONLY
Chlamydia: NEGATIVE
Comment: NEGATIVE
Comment: NORMAL
Neisseria Gonorrhea: NEGATIVE

## 2019-10-28 ENCOUNTER — Other Ambulatory Visit (HOSPITAL_COMMUNITY)
Admission: RE | Admit: 2019-10-28 | Discharge: 2019-10-28 | Disposition: A | Discharge status: Home / Self Care | Payer: Medicaid Other | Source: Ambulatory Visit | Attending: Family Medicine | Admitting: Family Medicine

## 2019-10-28 DIAGNOSIS — Z20822 Contact with and (suspected) exposure to covid-19: Secondary | ICD-10-CM | POA: Insufficient documentation

## 2019-10-28 DIAGNOSIS — Z01812 Encounter for preprocedural laboratory examination: Secondary | ICD-10-CM | POA: Insufficient documentation

## 2019-10-28 LAB — SARS CORONAVIRUS 2 (TAT 6-24 HRS): SARS Coronavirus 2: NEGATIVE

## 2019-10-29 ENCOUNTER — Ambulatory Visit: Payer: Medicaid Other

## 2019-10-29 ENCOUNTER — Other Ambulatory Visit (HOSPITAL_COMMUNITY): Payer: Self-pay | Admitting: Family Medicine

## 2019-10-30 ENCOUNTER — Inpatient Hospital Stay (HOSPITAL_COMMUNITY): Payer: Medicaid Other | Admitting: Anesthesiology

## 2019-10-30 ENCOUNTER — Inpatient Hospital Stay (HOSPITAL_COMMUNITY): Payer: Medicaid Other

## 2019-10-30 ENCOUNTER — Inpatient Hospital Stay (HOSPITAL_COMMUNITY)
Admission: AD | Admit: 2019-10-30 | Discharge: 2019-11-01 | DRG: 768 | Disposition: A | Discharge status: Home / Self Care | Payer: Medicaid Other | Attending: Obstetrics and Gynecology | Admitting: Obstetrics and Gynecology

## 2019-10-30 ENCOUNTER — Encounter (HOSPITAL_COMMUNITY): Payer: Self-pay | Admitting: Obstetrics and Gynecology

## 2019-10-30 DIAGNOSIS — O1002 Pre-existing essential hypertension complicating childbirth: Secondary | ICD-10-CM | POA: Diagnosis not present

## 2019-10-30 DIAGNOSIS — O139 Gestational [pregnancy-induced] hypertension without significant proteinuria, unspecified trimester: Secondary | ICD-10-CM | POA: Diagnosis present

## 2019-10-30 DIAGNOSIS — O24425 Gestational diabetes mellitus in childbirth, controlled by oral hypoglycemic drugs: Secondary | ICD-10-CM | POA: Diagnosis not present

## 2019-10-30 DIAGNOSIS — O134 Gestational [pregnancy-induced] hypertension without significant proteinuria, complicating childbirth: Secondary | ICD-10-CM | POA: Diagnosis present

## 2019-10-30 DIAGNOSIS — O2412 Pre-existing diabetes mellitus, type 2, in childbirth: Secondary | ICD-10-CM | POA: Diagnosis not present

## 2019-10-30 DIAGNOSIS — Z8759 Personal history of other complications of pregnancy, childbirth and the puerperium: Secondary | ICD-10-CM

## 2019-10-30 DIAGNOSIS — O24429 Gestational diabetes mellitus in childbirth, unspecified control: Secondary | ICD-10-CM | POA: Diagnosis not present

## 2019-10-30 DIAGNOSIS — Z3490 Encounter for supervision of normal pregnancy, unspecified, unspecified trimester: Secondary | ICD-10-CM

## 2019-10-30 DIAGNOSIS — O24415 Gestational diabetes mellitus in pregnancy, controlled by oral hypoglycemic drugs: Secondary | ICD-10-CM | POA: Diagnosis present

## 2019-10-30 DIAGNOSIS — Z9114 Patient's other noncompliance with medication regimen: Secondary | ICD-10-CM

## 2019-10-30 DIAGNOSIS — Z8632 Personal history of gestational diabetes: Secondary | ICD-10-CM | POA: Diagnosis present

## 2019-10-30 DIAGNOSIS — Z3A37 37 weeks gestation of pregnancy: Secondary | ICD-10-CM | POA: Diagnosis not present

## 2019-10-30 DIAGNOSIS — Z8751 Personal history of pre-term labor: Secondary | ICD-10-CM

## 2019-10-30 DIAGNOSIS — O43123 Velamentous insertion of umbilical cord, third trimester: Secondary | ICD-10-CM | POA: Diagnosis not present

## 2019-10-30 LAB — CBC
HCT: 32.4 % — ABNORMAL LOW (ref 36.0–46.0)
HCT: 31.7 % — ABNORMAL LOW (ref 36.0–46.0)
Hemoglobin: 9.7 g/dL — ABNORMAL LOW (ref 12.0–15.0)
Hemoglobin: 9.7 g/dL — ABNORMAL LOW (ref 12.0–15.0)
MCH: 19.3 pg — ABNORMAL LOW (ref 26.0–34.0)
MCH: 20 pg — ABNORMAL LOW (ref 26.0–34.0)
MCHC: 29.9 g/dL — ABNORMAL LOW (ref 30.0–36.0)
MCHC: 30.6 g/dL (ref 30.0–36.0)
MCV: 64.4 fL — ABNORMAL LOW (ref 80.0–100.0)
MCV: 65.2 fL — ABNORMAL LOW (ref 80.0–100.0)
Platelets: 140 10*3/uL — ABNORMAL LOW (ref 150–400)
Platelets: 153 10*3/uL (ref 150–400)
RBC: 5.03 MIL/uL (ref 3.87–5.11)
RBC: 4.86 MIL/uL (ref 3.87–5.11)
RDW: 19.7 % — ABNORMAL HIGH (ref 11.5–15.5)
RDW: 19.9 % — ABNORMAL HIGH (ref 11.5–15.5)
WBC: 5.8 10*3/uL (ref 4.0–10.5)
WBC: 11.3 10*3/uL — ABNORMAL HIGH (ref 4.0–10.5)
nRBC: 0 % (ref 0.0–0.2)
nRBC: 0 % (ref 0.0–0.2)

## 2019-10-30 LAB — COMPREHENSIVE METABOLIC PANEL
ALT: 9 U/L (ref 0–44)
AST: 16 U/L (ref 15–41)
Albumin: 2.6 g/dL — ABNORMAL LOW (ref 3.5–5.0)
Alkaline Phosphatase: 109 U/L (ref 38–126)
Anion gap: 11 (ref 5–15)
BUN: <5 mg/dL — ABNORMAL LOW (ref 6–20)
CO2: 20 mmol/L — ABNORMAL LOW (ref 22–32)
Calcium: 8.8 mg/dL — ABNORMAL LOW (ref 8.9–10.3)
Chloride: 105 mmol/L (ref 98–111)
Creatinine, Ser: 0.43 mg/dL — ABNORMAL LOW (ref 0.44–1.00)
GFR calc Af Amer: >60 mL/min (ref >60)
GFR calc non Af Amer: >60 mL/min (ref >60)
Glucose, Bld: 124 mg/dL — ABNORMAL HIGH (ref 70–99)
Potassium: 3.5 mmol/L (ref 3.5–5.1)
Sodium: 136 mmol/L (ref 135–145)
Total Bilirubin: 0.4 mg/dL (ref 0.3–1.2)
Total Protein: 6.3 g/dL — ABNORMAL LOW (ref 6.5–8.1)

## 2019-10-30 LAB — TYPE AND SCREEN
ABO/RH(D): A POS
Antibody Screen: NEGATIVE

## 2019-10-30 LAB — RPR: RPR Ser Ql: NONREACTIVE

## 2019-10-30 LAB — GLUCOSE, CAPILLARY
Glucose-Capillary: 117 mg/dL — ABNORMAL HIGH (ref 70–99)
Glucose-Capillary: 77 mg/dL (ref 70–99)

## 2019-10-30 MED ORDER — LIDOCAINE HCL (PF) 1 % IJ SOLN
30.0000 mL | INTRAMUSCULAR | Status: AC | PRN
  Administered 2019-10-30: 30 mL via SUBCUTANEOUS
  Filled 2019-10-30: qty 30

## 2019-10-30 MED ORDER — METHYLERGONOVINE MALEATE 0.2 MG/ML IJ SOLN
0.2000 mg | Freq: Once | INTRAMUSCULAR | Status: AC
  Administered 2019-10-30: 0.2 mg via INTRAMUSCULAR

## 2019-10-30 MED ORDER — OXYCODONE HCL 5 MG PO TABS: 10.0000 mg | ORAL_TABLET | ORAL | Status: DC | PRN

## 2019-10-30 MED ORDER — TETANUS-DIPHTH-ACELL PERTUSSIS 5-2.5-18.5 LF-MCG/0.5 IM SUSP: 0.5000 mL | Freq: Once | INTRAMUSCULAR | Status: DC

## 2019-10-30 MED ORDER — ONDANSETRON HCL 4 MG PO TABS
4.0000 mg | ORAL_TABLET | ORAL | Status: DC | PRN
  Administered 2019-10-30: 4 mg via ORAL
  Filled 2019-10-30: qty 1

## 2019-10-30 MED ORDER — ACETAMINOPHEN 325 MG PO TABS
650.0000 mg | ORAL_TABLET | ORAL | Status: DC | PRN
  Administered 2019-11-01: 650 mg via ORAL
  Filled 2019-10-30: qty 2

## 2019-10-30 MED ORDER — OXYCODONE-ACETAMINOPHEN 5-325 MG PO TABS: 2.0000 | ORAL_TABLET | ORAL | Status: DC | PRN

## 2019-10-30 MED ORDER — BENZOCAINE-MENTHOL 20-0.5 % EX AERO
1.0000 "application " | INHALATION_SPRAY | CUTANEOUS | Status: DC | PRN
  Filled 2019-10-30: qty 56

## 2019-10-30 MED ORDER — SENNOSIDES-DOCUSATE SODIUM 8.6-50 MG PO TABS
2.0000 | ORAL_TABLET | ORAL | Status: DC
  Administered 2019-10-30 – 2019-11-01 (×2): 2 via ORAL
  Filled 2019-10-30 (×2): qty 2

## 2019-10-30 MED ORDER — EPHEDRINE 5 MG/ML INJ: 10.0000 mg | INTRAVENOUS | Status: DC | PRN

## 2019-10-30 MED ORDER — PHENYLEPHRINE 40 MCG/ML (10ML) SYRINGE FOR IV PUSH (FOR BLOOD PRESSURE SUPPORT)
80.0000 ug | PREFILLED_SYRINGE | INTRAVENOUS | Status: DC | PRN
  Filled 2019-10-30: qty 10

## 2019-10-30 MED ORDER — SOD CITRATE-CITRIC ACID 500-334 MG/5ML PO SOLN: 30.0000 mL | ORAL | Status: DC | PRN

## 2019-10-30 MED ORDER — ONDANSETRON HCL 4 MG/2ML IJ SOLN: 4.0000 mg | INTRAMUSCULAR | Status: DC | PRN

## 2019-10-30 MED ORDER — SIMETHICONE 80 MG PO CHEW: 80.0000 mg | CHEWABLE_TABLET | ORAL | Status: DC | PRN

## 2019-10-30 MED ORDER — ACETAMINOPHEN 325 MG PO TABS: 650.0000 mg | ORAL_TABLET | ORAL | Status: DC | PRN

## 2019-10-30 MED ORDER — DIPHENHYDRAMINE HCL 25 MG PO CAPS: 25.0000 mg | ORAL_CAPSULE | Freq: Four times a day (QID) | ORAL | Status: DC | PRN

## 2019-10-30 MED ORDER — OXYTOCIN-SODIUM CHLORIDE 30-0.9 UT/500ML-% IV SOLN
INTRAVENOUS | Status: AC
  Filled 2019-10-30: qty 500

## 2019-10-30 MED ORDER — CEFAZOLIN SODIUM-DEXTROSE 2-4 GM/100ML-% IV SOLN: 2.0000 g | Freq: Once | INTRAVENOUS | Status: DC

## 2019-10-30 MED ORDER — OXYTOCIN-SODIUM CHLORIDE 30-0.9 UT/500ML-% IV SOLN: 2.5000 [IU]/h | INTRAVENOUS | Status: DC

## 2019-10-30 MED ORDER — MISOPROSTOL 200 MCG PO TABS
1000.0000 ug | ORAL_TABLET | Freq: Once | ORAL | Status: AC
  Administered 2019-10-30: 1000 ug via RECTAL

## 2019-10-30 MED ORDER — ONDANSETRON HCL 4 MG/2ML IJ SOLN: 4.0000 mg | Freq: Four times a day (QID) | INTRAMUSCULAR | Status: DC | PRN

## 2019-10-30 MED ORDER — LACTATED RINGERS IV SOLN
500.0000 mL | Freq: Once | INTRAVENOUS | Status: AC
  Administered 2019-10-30: 500 mL via INTRAVENOUS

## 2019-10-30 MED ORDER — OXYCODONE HCL 5 MG PO TABS
5.0000 mg | ORAL_TABLET | ORAL | Status: DC | PRN
  Administered 2019-11-01: 5 mg via ORAL
  Filled 2019-10-30: qty 1

## 2019-10-30 MED ORDER — OXYCODONE-ACETAMINOPHEN 5-325 MG PO TABS: 1.0000 | ORAL_TABLET | ORAL | Status: DC | PRN

## 2019-10-30 MED ORDER — LACTATED RINGERS IV SOLN: 500.0000 mL | INTRAVENOUS | Status: DC | PRN

## 2019-10-30 MED ORDER — PHENYLEPHRINE 40 MCG/ML (10ML) SYRINGE FOR IV PUSH (FOR BLOOD PRESSURE SUPPORT): 80.0000 ug | PREFILLED_SYRINGE | INTRAVENOUS | Status: DC | PRN

## 2019-10-30 MED ORDER — COCONUT OIL OIL: 1.0000 "application " | TOPICAL_OIL | Status: DC | PRN

## 2019-10-30 MED ORDER — METHYLERGONOVINE MALEATE 0.2 MG/ML IJ SOLN
INTRAMUSCULAR | Status: AC
  Filled 2019-10-30: qty 1

## 2019-10-30 MED ORDER — SODIUM CHLORIDE (PF) 0.9 % IJ SOLN
INTRAMUSCULAR | Status: DC | PRN
  Administered 2019-10-30: 12 mL/h via EPIDURAL

## 2019-10-30 MED ORDER — PRENATAL MULTIVITAMIN CH
1.0000 | ORAL_TABLET | Freq: Every day | ORAL | Status: DC
  Administered 2019-10-31: 1 via ORAL
  Filled 2019-10-30: qty 1

## 2019-10-30 MED ORDER — OXYTOCIN BOLUS FROM INFUSION
333.0000 mL | Freq: Once | INTRAVENOUS | Status: AC
  Administered 2019-10-30: 333 mL via INTRAVENOUS

## 2019-10-30 MED ORDER — TERBUTALINE SULFATE 1 MG/ML IJ SOLN: 0.2500 mg | Freq: Once | INTRAMUSCULAR | Status: DC | PRN

## 2019-10-30 MED ORDER — OXYTOCIN-SODIUM CHLORIDE 30-0.9 UT/500ML-% IV SOLN
1.0000 m[IU]/min | INTRAVENOUS | Status: DC
  Administered 2019-10-30: 2 m[IU]/min via INTRAVENOUS

## 2019-10-30 MED ORDER — TRANEXAMIC ACID-NACL 1000-0.7 MG/100ML-% IV SOLN
INTRAVENOUS | Status: AC
  Administered 2019-10-30: 1000 mg
  Filled 2019-10-30: qty 100

## 2019-10-30 MED ORDER — WITCH HAZEL-GLYCERIN EX PADS: 1.0000 "application " | MEDICATED_PAD | CUTANEOUS | Status: DC | PRN

## 2019-10-30 MED ORDER — POLYETHYLENE GLYCOL 3350 17 G PO PACK
17.0000 g | PACK | Freq: Every day | ORAL | Status: DC
  Administered 2019-10-31: 17 g via ORAL
  Filled 2019-10-30 (×2): qty 1

## 2019-10-30 MED ORDER — DOCUSATE SODIUM 100 MG PO CAPS
100.0000 mg | ORAL_CAPSULE | Freq: Two times a day (BID) | ORAL | Status: DC
  Administered 2019-10-30 – 2019-11-01 (×3): 100 mg via ORAL
  Filled 2019-10-30 (×3): qty 1

## 2019-10-30 MED ORDER — FENTANYL-BUPIVACAINE-NACL 0.5-0.125-0.9 MG/250ML-% EP SOLN
12.0000 mL/h | EPIDURAL | Status: DC | PRN
  Filled 2019-10-30: qty 250

## 2019-10-30 MED ORDER — LACTATED RINGERS IV SOLN: INTRAVENOUS | Status: DC

## 2019-10-30 MED ORDER — MISOPROSTOL 200 MCG PO TABS
ORAL_TABLET | ORAL | Status: AC
  Filled 2019-10-30: qty 5

## 2019-10-30 MED ORDER — DIBUCAINE (PERIANAL) 1 % EX OINT: 1.0000 "application " | TOPICAL_OINTMENT | CUTANEOUS | Status: DC | PRN

## 2019-10-30 MED ORDER — IBUPROFEN 600 MG PO TABS
600.0000 mg | ORAL_TABLET | Freq: Four times a day (QID) | ORAL | Status: DC
  Administered 2019-10-30 – 2019-11-01 (×6): 600 mg via ORAL
  Filled 2019-10-30 (×6): qty 1

## 2019-10-30 MED ORDER — DIPHENHYDRAMINE HCL 50 MG/ML IJ SOLN: 12.5000 mg | INTRAMUSCULAR | Status: DC | PRN

## 2019-10-30 MED ORDER — MEASLES, MUMPS & RUBELLA VAC IJ SOLR: 0.5000 mL | Freq: Once | INTRAMUSCULAR | Status: DC

## 2019-10-30 MED ORDER — LIDOCAINE HCL (PF) 1 % IJ SOLN
INTRAMUSCULAR | Status: DC | PRN
  Administered 2019-10-30 (×2): 5 mL via EPIDURAL

## 2019-10-30 NOTE — Lactation Note (Addendum)
This note was copied from a baby's chart. Lactation Consultation Note  Patient Name: Kaitlin [name] DTOIZ'T Date: 10/30/2019   Lactation spoke with RN, Henrietta Hoover, this evening regarding Ms. Lantis's desire to see lactation. Baby is 4 hours old, but has received formula. RN reports concerns with baby's blood sugar. We discussed maternal indications for lactation (hypertension and GDM). Henrietta Hoover states that she asked Ms. Pollman if she would like to be seen by lactation, and Ms. Kuroda declined citing prior experience with breast feeding. We discussed setting up a DEBP. I indicated that I would place Ms. Mcnabb's status as PRN.   Feeding Feeding Type: Bottle Fed - Formula Nipple Type: Slow - flow    Consult Status Consult Status: PRN    Walker Shadow 10/30/2019, 10:00 PM

## 2019-10-30 NOTE — Plan of Care (Signed)
  Problem: Education: Goal: Knowledge of General Education information will improve Description: Including pain rating scale, medication(s)/side effects and non-pharmacologic comfort measures Outcome: Completed/Met   Problem: Health Behavior/Discharge Planning: Goal: Ability to manage health-related needs will improve Outcome: Completed/Met   Problem: Clinical Measurements: Goal: Ability to maintain clinical measurements within normal limits will improve Outcome: Completed/Met Goal: Will remain free from infection Outcome: Completed/Met Goal: Diagnostic test results will improve Outcome: Completed/Met Goal: Respiratory complications will improve Outcome: Completed/Met Goal: Cardiovascular complication will be avoided Outcome: Completed/Met   Problem: Activity: Goal: Risk for activity intolerance will decrease Outcome: Completed/Met   Problem: Nutrition: Goal: Adequate nutrition will be maintained Outcome: Completed/Met   Problem: Coping: Goal: Level of anxiety will decrease Outcome: Completed/Met   Problem: Elimination: Goal: Will not experience complications related to bowel motility Outcome: Completed/Met Goal: Will not experience complications related to urinary retention Outcome: Completed/Met   Problem: Pain Managment: Goal: General experience of comfort will improve Outcome: Completed/Met   Problem: Safety: Goal: Ability to remain free from injury will improve Outcome: Completed/Met   Problem: Skin Integrity: Goal: Risk for impaired skin integrity will decrease Outcome: Completed/Met   Problem: Activity: Goal: Risk for activity intolerance will decrease Outcome: Completed/Met   Problem: Nutrition: Goal: Adequate nutrition will be maintained Outcome: Completed/Met   Problem: Coping: Goal: Level of anxiety will decrease Outcome: Completed/Met   Problem: Elimination: Goal: Will not experience complications related to bowel motility Outcome:  Completed/Met   Problem: Elimination: Goal: Will not experience complications related to urinary retention Outcome: Completed/Met   Problem: Pain Managment: Goal: General experience of comfort will improve Outcome: Completed/Met   Problem: Safety: Goal: Ability to remain free from injury will improve Outcome: Completed/Met   Problem: Skin Integrity: Goal: Risk for impaired skin integrity will decrease Outcome: Completed/Met

## 2019-10-30 NOTE — Progress Notes (Addendum)
Distress alarm went off in room 2-11. I attended to room where active management of shoulder dystocia was underway, approximately 38m30s in. Left shoulder anterior at 11 o clock, right shoulder posterior with RUE slightly posterior to fetal torso. Attempted corkscrew maneuver but no adjustment noted. Proceeded with delivery of posterior arm. Episiotomy cut, able to hook axilla and lift slightly. Braced humerus in attempt to sweep UE up over thorax, humerus palpable broken during attempt. Posterior arm then delivered without issue, anterior shoulder followed with maternal effort and minimal traction. Cord cut and clamped and infant passed quickly to awaiting NICU team. At this time. Dr Vergie Living, faculty attending, took over care allowing with [facility] fellows.

## 2019-10-30 NOTE — Discharge Summary (Signed)
Postpartum Discharge Summary     Patient Name: Kaitlin [name] DOB: 09/02/1990 MRN: [id number]  Date of admission: 10/30/2019 Delivery date:10/30/2019  Delivering provider: Merilyn Baba  Date of discharge: 11/01/2019  Admitting diagnosis: Encounter for induction of labor [Z34.90] Intrauterine pregnancy: 107w0d    Secondary diagnosis:  Principal Problem:   Encounter for induction of labor Active Problems:   History of preterm delivery, currently pregnant   Gestational diabetes mellitus (GDM) controlled on oral hypoglycemic drug   Gestational hypertension   Vaginal delivery   Shoulder dystocia, delivered   PLookeba(postpartum hemorrhage)   Type 3a perineal laceration  Additional problems: none    Discharge diagnosis: Term Pregnancy Delivered, Gestational Hypertension, PPH and uncontrolled GDM                                              Post partum procedures:None Augmentation: AROM and Pitocin Complications: Shoulder dystocia   Hospital course: Induction of Labor With Vaginal Delivery   29y.o. yo [id number] admitted to the hospital 10/30/2019 for induction of labor.  Indication for induction: Gestational hypertension and A2 DM(non-compliant with medication). Patient initially presented at 3cm dilated and labor was augmented with pitocin and AROM. Patient progressed to complete without difficulty, and delivery was complicated by a 4-minute shoulder dystocia. Membrane Rupture Time/Date: 1:25 PM ,10/30/2019   Delivery Method:Vaginal, Spontaneous  Episiotomy: Median  Lacerations:  3rd degree;Perineal  Details of delivery can be found in separate delivery note. Bowel regimen initiated. Norvasc initiated due to mild-range pressures. Patient had a routine postpartum course. Patient is discharged home 11/01/19.  Newborn Data: Birth date:10/30/2019  Birth time:5:04 PM  Gender:Female  Living status:Living  Apgars:5 ,9  Weight:4380 g   Magnesium Sulfate received:  No BMZ received: No Rhophylac:No MMR:No T-DaP:declined Flu: N/A Transfusion:No  Physical exam  Vitals:   10/31/19 0515 10/31/19 0925 10/31/19 1450 10/31/19 2147  BP: 107/71 115/79 120/76 (!) 114/90  Pulse: 73 89 89 86  Resp: '18 18 16 18  ' Temp: 97.6 F (36.4 C) 98.2 F (36.8 C) 98.4 F (36.9 C) 98.2 F (36.8 C)  TempSrc: Oral Oral Oral Oral  SpO2: 99% 99% 100% 100%  Weight:      Height:       General: alert, cooperative and no distress Lochia: appropriate Uterine Fundus: firm Incision: N/A DVT Evaluation: No evidence of DVT seen on physical exam. Labs: Lab Results  Component Value Date   WBC 8.2 10/31/2019   HGB 8.2 (L) 10/31/2019   HCT 27.4 (L) 10/31/2019   MCV 64.0 (L) 10/31/2019   PLT 147 (L) 10/31/2019   CMP Latest Ref Rng & Units 10/30/2019  Glucose 70 - 99 mg/dL 124(H)  BUN 6 - 20 mg/dL <5(L)  Creatinine 0.44 - 1.00 mg/dL 0.43(L)  Sodium 135 - 145 mmol/L 136  Potassium 3.5 - 5.1 mmol/L 3.5  Chloride 98 - 111 mmol/L 105  CO2 22 - 32 mmol/L 20(L)  Calcium 8.9 - 10.3 mg/dL 8.8(L)  Total Protein 6.5 - 8.1 g/dL 6.3(L)  Total Bilirubin 0.3 - 1.2 mg/dL 0.4  Alkaline Phos 38 - 126 U/L 109  AST 15 - 41 U/L 16  ALT 0 - 44 U/L 9   Edinburgh Score: Edinburgh Postnatal Depression Scale Screening Tool 10/31/2019  I have been able to laugh and see the funny side of  things. 0  I have looked forward with enjoyment to things. 0  I have blamed myself unnecessarily when things went wrong. 0  I have been anxious or worried for no good reason. 0  I have felt scared or panicky for no good reason. 0  Things have been getting on top of me. 0  I have been so unhappy that I have had difficulty sleeping. 0  I have felt sad or miserable. 0  I have been so unhappy that I have been crying. 0  The thought of harming myself has occurred to me. 0  Edinburgh Postnatal Depression Scale Total 0     After visit meds:  Allergies as of 11/01/2019      Reactions   Skin Bleaching  [hydroquinone]       Medication List    STOP taking these medications   Accu-Chek Guide w/Device Kit   Accu-Chek Softclix Lancets lancets   Blood Pressure Kit Devi   Comfort Fit Maternity Supp Lg Misc   Doxylamine-Pyridoxine 10-10 MG Tbec Commonly known as: Diclegis   glucose blood test strip   metFORMIN 500 MG tablet Commonly known as: GLUCOPHAGE   pantoprazole 20 MG tablet Commonly known as: Protonix   promethazine 25 MG tablet Commonly known as: PHENERGAN     TAKE these medications   acetaminophen 325 MG tablet Commonly known as: Tylenol Take 2 tablets (650 mg total) by mouth every 6 (six) hours as needed (for pain scale < 4).   amLODipine 2.5 MG tablet Commonly known as: NORVASC Take 1 tablet (2.5 mg total) by mouth daily.   docusate sodium 100 MG capsule Commonly known as: COLACE Take 1 capsule (100 mg total) by mouth 2 (two) times daily.   ferrous sulfate 325 (65 FE) MG tablet Take 1 tablet (325 mg total) by mouth 2 (two) times daily with a meal.   ibuprofen 600 MG tablet Commonly known as: ADVIL Take 1 tablet (600 mg total) by mouth every 6 (six) hours.   polyethylene glycol 17 g packet Commonly known as: MIRALAX / GLYCOLAX Take 17 g by mouth daily.   PrePLUS 27-1 MG Tabs Take 1 tablet by mouth daily.        Discharge home in stable condition Infant Feeding: Bottle and Breast Infant Disposition:home with mother Discharge instruction: per After Visit Summary and Postpartum booklet. Activity: Advance as tolerated. Pelvic rest for 6 weeks.  Diet: routine diet Future Appointments: Future Appointments  Date Time Provider [place]  11/07/2019  9:45 AM Woodroe Mode, MD [place] None  12/10/2019  8:45 AM [facility] None  12/10/2019  9:00 AM Sloan Leiter, MD [facility] None   Follow up Visit:   Please schedule this patient for a In person postpartum visit in 4 weeks with the following provider: Any provider. Additional  Postpartum F/U:BP check 1 week , 1 week MD follow up for 3A lac High risk pregnancy complicated by: GDM and HTN Delivery mode:  Vaginal, Spontaneous  Anticipated Birth Control:  declines   11/01/2019 Chauncey Mann, MD

## 2019-10-30 NOTE — Anesthesia Preprocedure Evaluation (Signed)
Anesthesia Evaluation  Patient identified by MRN, date of birth, ID band Patient awake    Reviewed: Allergy & Precautions, NPO status , Patient's Chart, lab work & pertinent test results  Airway Mallampati: II  TM Distance: >3 FB Neck ROM: Full    Dental no notable dental hx. (+) Dental Advisory Given   Pulmonary neg pulmonary ROS,    Pulmonary exam normal        Cardiovascular hypertension, Normal cardiovascular exam     Neuro/Psych negative neurological ROS  negative psych ROS   GI/Hepatic negative GI ROS, Neg liver ROS,   Endo/Other  diabetes  Renal/GU negative Renal ROS  negative genitourinary   Musculoskeletal negative musculoskeletal ROS (+)   Abdominal   Peds negative pediatric ROS (+)  Hematology negative hematology ROS (+)   Anesthesia Other Findings   Reproductive/Obstetrics negative OB ROS                             Anesthesia Physical Anesthesia Plan  ASA: II  Anesthesia Plan: Epidural   Post-op Pain Management:    Induction:   PONV Risk Score and Plan:   Airway Management Planned: Simple Face Mask  Additional Equipment:   Intra-op Plan:   Post-operative Plan:   Informed Consent: I have reviewed the patients History and Physical, chart, labs and discussed the procedure including the risks, benefits and alternatives for the proposed anesthesia with the patient or authorized representative who has indicated his/her understanding and acceptance.     Dental advisory given  Plan Discussed with: Anesthesiologist  Anesthesia Plan Comments:         Anesthesia Quick Evaluation

## 2019-10-30 NOTE — H&P (Addendum)
OBSTETRIC ADMISSION HISTORY AND PHYSICAL  Kaitlin [name] is a 29 y.o. female [phone] with IUP at 53w0dby 2nd trimester u/s presenting for IOL 2/2 non-compliant GDM. She reports +FMs, No LOF, no VB, no blurry vision, headaches or peripheral edema, and RUQ pain.  She plans on breast/bottle feeding. She declines birth control. [facility] 2[facility] u/s --->  Estimated Date of Delivery: 11/20/19  Sono:    10/02/19: '@[redacted]w[redacted]d' , CWD, normal anatomy, cephalic presentation, marginal cord insertion, anterior placenta, 2601g, 96%ile   Prenatal History/Complications:  History of preterm delivery Noncompliant GDM  gHTN   Past Medical History: Past Medical History:  Diagnosis Date  . Gestational diabetes     Past Surgical History: Past Surgical History:  Procedure Laterality Date  . WRegino RamirezEXTRACTION  2014    Obstetrical History: OB History    Gravida  5   Para  4   Term  3   Preterm  1   AB      Living  4     SAB      TAB      Ectopic      Multiple      Live Births  4           Social History Social History   Socioeconomic History  . Marital status: Single    Spouse name: Not on file  . Number of children: Not on file  . Years of education: Not on file  . Highest education level: Not on file  Occupational History  . Not on file  Tobacco Use  . Smoking status: Never Smoker  . Smokeless tobacco: Never Used  Vaping Use  . Vaping Use: Never used  Substance and Sexual Activity  . Alcohol use: Never  . Drug use: Never  . Sexual activity: Yes    Birth control/protection: None  Other Topics Concern  . Not on file  Social History Narrative  . Not on file   Social Determinants of Health   Financial Resource Strain:   . Difficulty of Paying Living Expenses:   Food Insecurity:   . Worried About RCharity fundraiserin the Last Year:   . RArboriculturistin the Last Year:   Transportation Needs:   . LConsulting civil engineer(Medical):   .Marland KitchenLack of Transportation (Non-Medical):   Physical Activity:   . Days of Exercise per Week:   . Minutes of Exercise per Session:   Stress:   . Feeling of Stress :   Social Connections:   . Frequency of Communication with Friends and Family:   . Frequency of Social Gatherings with Friends and Family:   . Attends Religious Services:   . Active Member of Clubs or Organizations:   . Attends CArchivistMeetings:   .Marland KitchenMarital Status:     Family History: Family History  Problem Relation Age of Onset  . Hypertension Mother     Allergies: Allergies  Allergen Reactions  . Skin Bleaching [Hydroquinone]     Medications Prior to Admission  Medication Sig Dispense Refill Last Dose  . Accu-Chek Softclix Lancets lancets Use 1 lancet to check blood sugar 4 times daily 100 each 12   . Blood Glucose Monitoring Suppl (ACCU-CHEK GUIDE) w/Device KIT 1 Device by Does not apply route in the morning, at noon, in the evening, and at bedtime. 1 kit 0   . Blood Pressure Monitoring (BLOOD  PRESSURE KIT) DEVI 1 Device by Does not apply route as needed. Check Blood Pressure regularly and record readings into the Bab[employer] App.  Large Cuff.  DX O90.0 1 each 0   . Doxylamine-Pyridoxine (DICLEGIS) 10-10 MG TBEC Take 2 tabs qhs then add 1 tab A.M and midday prn 90 tablet 6   . Elastic Bandages & Supports (COMFORT FIT MATERNITY SUPP LG) MISC 1 Units by Does not apply route daily. 1 each 0   . ferrous sulfate 325 (65 FE) MG tablet Take 1 tablet (325 mg total) by mouth 2 (two) times daily with a meal. 60 tablet 5   . glucose blood test strip Use as instructed 100 each 12   . metFORMIN (GLUCOPHAGE) 500 MG tablet Take 1 tablet (500 mg total) by mouth 2 (two) times daily with a meal. (Patient not taking: Reported on 10/01/2019) 60 tablet 2   . pantoprazole (PROTONIX) 20 MG tablet Take 1 tablet (20 mg total) by mouth daily. 30 tablet 1   . Prenatal Vit-Fe Fumarate-FA (PREPLUS)  27-1 MG TABS Take 1 tablet by mouth daily. 30 tablet 13   . promethazine (PHENERGAN) 25 MG tablet Take 1 tablet (25 mg total) by mouth every 6 (six) hours as needed for nausea or vomiting. 30 tablet 2      Review of Systems   All systems reviewed and negative except as stated in HPI  Blood pressure 130/86, pulse (!) 105, temperature 98.8 F (37.1 C), temperature source Oral, resp. rate 15, height '5\' 4"'  (1.626 m), weight 88 kg, last menstrual period 01/26/2019, SpO2 99 %. General appearance: alert, cooperative and no distress Lungs: normal effort Heart: regular rate and rhythm Abdomen: soft, non-tender; bowel sounds normal Pelvic: deferred Extremities: no sign of DVT Presentation: cephalic Fetal monitoringBaseline: 150 bpm, Variability: Good {> 6 bpm), Accelerations: Reactive and Decelerations: Absent Uterine activityFrequency: occasional Dilation: 3 Effacement (%): 60 Station: -3, -2 Exam by:: Carter Kitten RNC   Prenatal labs: ABO, Rh: A/Positive/-- (04/07 1003) Antibody: Negative (04/07 1003) Rubella: 4.19 (04/07 1003) RPR: Non Reactive (05/25 0949)  HBsAg: Negative (04/07 1003)  HIV: Non Reactive (05/25 0949)  GBS: Negative/-- (07/16 1106)  2 hr Glucola: fasting 180/1hr 316/ 2hr 280, diagnosed A2GDM Genetic screening  declined Anatomy US nml  Prenatal Transfer Tool  Maternal Diabetes: uncontrolled GDM, noncompliant with meds/diet Genetic Screening: Declined Maternal Ultrasounds/Referrals: Normal Fetal Ultrasounds or other Referrals:  Referred to Materal Fetal Medicine  Maternal Substance Abuse:  No Significant Maternal Medications:  None Significant Maternal Lab Results: Group B Strep negative  Results for orders placed or performed during the hospital encounter of 10/30/19 (from the past 24 hour(s))  Glucose, capillary   Collection Time: 10/30/19  7:59 AM  Result Value Ref Range   Glucose-Capillary 117 (H) 70 - 99 mg/dL    Patient Active Problem List   Diagnosis  Date Noted  . Encounter for induction of labor 10/30/2019  . Gestational hypertension 10/24/2019  . Gestational diabetes mellitus (GDM) controlled on oral hypoglycemic drug 09/03/2019  . Migraine 07/16/2019  . 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 Supervision of high risk pregnancy, antepartum 04/17/2019  . History of preterm delivery, currently pregnant 04/17/2019    Assessment/Plan:  Xyla Leisner is a 29 y.o. [id number] at 52w0dhere for IOL 2/2 uncontrolled GDM.  #Labor: Initiate pitocin, continue to augment as appropriate. Risks/benefits of IOL and shoulder dystocia discussed as noted below. #Gestational diabetes: failed 2hr GTT, non-compliant with meds throughout pregnancy. CBG  q4 and then q2 during active labor. Consider meds if BGL >120. NPO on pit.  #gHTN: not on meds during pregnancy, borderline BP while on L&D 130s/80s. Continue to monitor. PreE labs pending. Asymptomatic.  #Hx of pre-term labor: currently term #Pain: PRN #FWB: Cat 1, EFW by leopolds #9 #ID: not indicated #MOF: breast/bottle feeding #MOC: declines #Circ: n/a  Risks and benefits of induction were reviewed, including failure of method, prolonged labor, need for further intervention, risk of cesarean.  Patient and family seem to understand these risks and wish to proceed. Options of cytotech, foley bulb, AROM, and pitocin reviewed, with use of each discussed.  Shoulder precautions discussed in detail, including but not limited to: need for additional procedures, additional providers in the room, attendance of NICU staff, potential need for emergent Cesarean delivery.  Arrie Senate, MD  10/30/2019, 8:55 AM

## 2019-10-30 NOTE — Progress Notes (Signed)
Labor Progress Note Kaitlin [name] is a 29 y.o. [id number] at [redacted]w[redacted]d presented for IOL 2/2 non-compliant GDM.  S: Doing well without complaints, desiring CSE prior to AROM.  O:  BP (!) 130/89   Pulse (!) 107   Temp 98.8 F (37.1 C) (Oral)   Resp 15   Ht 5\' 4"  (1.626 m)   Wt 88 kg   LMP 01/26/2019 (Approximate)   SpO2 99%   BMI 33.30 kg/m  EFM: baseline 150bmp/moderate variability/+accels/no decels   CVE: Dilation: 3 Effacement (%): 60 Cervical Position: Middle Station: -3, -2 Presentation: Vertex Exam by:: [email or web address] RNC   [name]: 29 y.o. 26 [redacted]w[redacted]d presented for IOL 2/2 non-compliant GDM.  #Labor: Progressing well. Continue labor augmentation as appropriate. Currently pit @10 .  #Pain: desires CSE #FWB: cat 1 #GBS negative #Gestational diabetes: failed 2hr GTT, non-compliant with meds throughout pregnancy. CBG q4 and then q2 during active labor. Consider meds if BGL >120. NPO on pit.  #gHTN: not on meds during pregnancy, borderline BP while on L&D 130s/80s. Continue to monitor. PreE labs unremarkable. Asymptomatic.   [redacted]w[redacted]d, MD 12:12 PM

## 2019-10-30 NOTE — Progress Notes (Signed)
Resident wants to AROM.  Pt wants epidural before procedure.

## 2019-10-30 NOTE — Progress Notes (Signed)
Pt states "she doesn't have diabetes"  Does not believe result of our CBG machine.  Retook CBG with her machine.  Result was 111.

## 2019-10-30 NOTE — Anesthesia Procedure Notes (Signed)
Epidural Patient location during procedure: OB Start time: 10/30/2019 12:31 PM End time: 10/30/2019 12:46 PM  Staffing Anesthesiologist: Heather Roberts, MD Performed: anesthesiologist   Preanesthetic Checklist Completed: patient identified, IV checked, site marked, risks and benefits discussed, monitors and equipment checked, pre-op evaluation and timeout performed  Epidural Patient position: sitting Prep: DuraPrep Patient monitoring: heart rate, cardiac monitor, continuous pulse ox and blood pressure Approach: midline Location: L2-L3 Injection technique: LOR saline  Needle:  Needle type: Tuohy  Needle gauge: 17 G Needle length: 9 cm Needle insertion depth: 6 cm Catheter size: 20 Guage Catheter at skin depth: 11 cm Test dose: negative and Other  Assessment Events: blood not aspirated, injection not painful, no injection resistance and negative IV test  Additional Notes Informed consent obtained prior to proceeding including risk of failure, 1% risk of PDPH, risk of minor discomfort and bruising.  Discussed rare but serious complications including epidural abscess, permanent nerve injury, epidural hematoma.  Discussed alternatives to epidural analgesia and patient desires to proceed.  Timeout performed pre-procedure verifying patient name, procedure, and platelet count.  Patient tolerated procedure well.

## 2019-10-30 NOTE — Progress Notes (Addendum)
Labor Progress Note Kaitlin [name] is a 29 y.o. [id number] at [redacted]w[redacted]d presented for IOL 2/2 non-compliant GDM.  S: Doing well without complaints.  O:  BP 124/70    Pulse 101    Temp 98.7 F (37.1 C) (Oral)    Resp 15    Ht 5\' 4"  (1.626 m)    Wt 88 kg    LMP 01/26/2019 (Approximate)    SpO2 99%    BMI 33.30 kg/m  EFM: baseline 150bpm/moderate variability/+accels/no decels  Toco: contractions every 2-5 minutes  CVE: Dilation: 4 Effacement (%): 50 Cervical Position: Middle Station: -2 Presentation: Vertex Exam by:: Dr [email or web address]   [name]: 29 y.o. 26 [redacted]w[redacted]d presented for IOL 2/2 non-compliant GDM.  #Labor: Progressing well. Continue labor augmentation as appropriate. Currently pit @14 . Risks/benefits of AROM discussed and performed at 1330 with clear fluid. #Pain: CSE #FWB: cat 1 #GBS negative #Gestational diabetes: failed 2hr GTT, non-compliant with meds throughout pregnancy. CBG q4 and then q2 during active labor. Consider meds if BGL >120. NPO on pit.  #gHTN: not on meds during pregnancy, borderline BP while on L&D 130s/80s. Continue to monitor. PreE labs unremarkable. Asymptomatic.   [redacted]w[redacted]d, MD 1:29 PM

## 2019-10-31 LAB — CBC
HCT: 27.4 % — ABNORMAL LOW (ref 36.0–46.0)
Hemoglobin: 8.2 g/dL — ABNORMAL LOW (ref 12.0–15.0)
MCH: 19.2 pg — ABNORMAL LOW (ref 26.0–34.0)
MCHC: 29.9 g/dL — ABNORMAL LOW (ref 30.0–36.0)
MCV: 64 fL — ABNORMAL LOW (ref 80.0–100.0)
Platelets: 147 10*3/uL — ABNORMAL LOW (ref 150–400)
RBC: 4.28 MIL/uL (ref 3.87–5.11)
RDW: 19.3 % — ABNORMAL HIGH (ref 11.5–15.5)
WBC: 8.2 10*3/uL (ref 4.0–10.5)
nRBC: 0 % (ref 0.0–0.2)

## 2019-10-31 MED ORDER — AMLODIPINE BESYLATE 5 MG PO TABS
2.5000 mg | ORAL_TABLET | Freq: Every day | ORAL | Status: DC
  Filled 2019-10-31: qty 1

## 2019-10-31 MED ORDER — FERROUS SULFATE 325 (65 FE) MG PO TABS
325.0000 mg | ORAL_TABLET | Freq: Every day | ORAL | Status: DC
  Administered 2019-10-31: 325 mg via ORAL
  Filled 2019-10-31: qty 1

## 2019-10-31 NOTE — Anesthesia Postprocedure Evaluation (Signed)
Anesthesia Post Note  Patient: Kaitlin [name]  Procedure(s) Performed: AN AD HOC LABOR EPIDURAL     Patient location during evaluation: Mother Baby Anesthesia Type: Epidural Level of consciousness: awake and alert and oriented Pain management: satisfactory to patient Vital Signs Assessment: post-procedure vital signs reviewed and stable Respiratory status: respiratory function stable Cardiovascular status: stable Postop Assessment: no headache, no backache, epidural receding, patient able to bend at knees, no signs of nausea or vomiting, adequate PO intake and able to ambulate Anesthetic complications: no   No complications documented.  Last Vitals:  Vitals:   10/31/19 0200 10/31/19 0515  BP: (!) 112/86 107/71  [name]: 82 73  Resp: 18 18  Temp: 36.7 C 36.4 C  SpO2: 100% 99%    Last Pain:  Vitals:   10/31/19 0515  TempSrc: Oral  PainSc:    Pain Goal:                   Artisha Capri

## 2019-10-31 NOTE — Progress Notes (Addendum)
POSTPARTUM PROGRESS NOTE  Subjective: Kaitlin [name] is a 29 y.o. [id number] PPD#1 s/p VD at [redacted]w[redacted]d.  She reports she doing well. No acute events overnight. She denies any problems with ambulating, voiding or po intake. Denies nausea or vomiting. She has passed flatus. Pain is well controlled.  Lochia is appropriate.   Objective: Blood pressure 107/71, pulse 73, temperature 97.6 F (36.4 C), temperature source Oral, resp. rate 18, height 5\' 4"  (1.626 m), weight 88 kg, last menstrual period 01/26/2019, SpO2 99 %.  Physical Exam:  General: alert, cooperative and no distress Chest: no respiratory distress Abdomen: soft, non-tender  Uterine Fundus: firm, appropriately tender Extremities: No calf swelling or tenderness  no edema  Recent Labs    10/30/19 1950 10/31/19 0521  HGB 9.7* 8.2*  HCT 31.7* 27.4*    Assessment/Plan: Kaitlin [name] is a 29 y.o. 26 s/p VD at [redacted]w[redacted]d for uncontrolled diabetes.  Routine Postpartum Care: Doing well, pain well-controlled.  -- Continue routine care, lactation support  -- Contraception: Declines -- Feeding: Breast and bottle  #PPH: loss of [redacted]w[redacted]d during delivery. Hgb 9.7>9.7>8.2. Currently on ferrous sulfate. Asymptomatic.  #GDM: Non-compliant with meds during pregnancy. Fasting CBG 77. No medications indicated at this time. #gHTN: not on medication during pregnancy. PreE labs unremarkable, patient asymptomatic. Several blood pressures have been elevated in the 130s/80s with the highest in the last 24 hours being 148/97. Starting Norvasc 2.5mg .  Dispo: Plan for discharge tomorrow.  , DO PGY1 Family Medicine Resident  I saw and evaluated the patient. I agree with the findings and the plan of care as documented in the resident's note.  Evelena Leyden, MD [facility] Family Medicine Fellow, [facility] for [facility]

## 2019-10-31 NOTE — Lactation Note (Signed)
This note was copied from a baby's chart. Lactation Consultation Note  Patient Name: Kaitlin [name] [id number] Date: 10/31/2019  P4, 30 hour ETI infant. In telephone call to Mom's room, mom decline being seen by [facility] services tonight.    Maternal Data    Feeding    LATCH Score                   Interventions    Lactation Tools Discussed/Used     Consult Status      Kaitlin [name] 10/31/2019, 11:18 PM

## 2019-11-01 MED ORDER — AMLODIPINE BESYLATE 2.5 MG PO TABS: 2.5000 mg | ORAL_TABLET | Freq: Every day | ORAL | 0 refills | Status: DC

## 2019-11-01 MED ORDER — IBUPROFEN 600 MG PO TABS: 600.0000 mg | ORAL_TABLET | Freq: Four times a day (QID) | ORAL | 0 refills | Status: DC

## 2019-11-01 MED ORDER — DOCUSATE SODIUM 100 MG PO CAPS: 100.0000 mg | ORAL_CAPSULE | Freq: Two times a day (BID) | ORAL | 0 refills | Status: DC

## 2019-11-01 MED ORDER — ACETAMINOPHEN 325 MG PO TABS: 650.0000 mg | ORAL_TABLET | Freq: Four times a day (QID) | ORAL | 0 refills | Status: DC | PRN

## 2019-11-01 MED ORDER — POLYETHYLENE GLYCOL 3350 17 G PO PACK: 17.0000 g | PACK | Freq: Every day | ORAL | 0 refills | Status: DC

## 2019-11-01 NOTE — Lactation Note (Signed)
This note was copied from a baby's chart. Lactation Consultation Note  Patient Name: Kaitlin [name] [id number] Date: 11/01/2019 Reason for consult: Follow-up assessment  P4 mother whose infant is now 42 hours old.  This is an ETI at 37+0 weeks.  Mother's feeding preference is breast/bottle.  Mother has been primarily formula feeding.  She does not have any questions/concerns related to breast feeding.  Mother denies pain with latching and states that her breasts/nipples feel fine.  Engorgement prevention/treatment discussed.  Mother does not have a manual pump or a DEBP.  Offered to provide a manual pump but mother politely declined.  She is not interested in obtaining a DEBP either.  She has our OP phone number for any questions/concerns.  Father present.   Maternal Data    Feeding Feeding Type: Bottle Fed - Formula Nipple Type: Slow - flow  LATCH Score                   Interventions    Lactation Tools Discussed/Used     Consult Status Consult Status: Complete    Kaitlin [name] Kaitlin [name] 11/01/2019, 8:49 AM

## 2019-11-01 NOTE — Discharge Instructions (Signed)

## 2019-11-03 ENCOUNTER — Telehealth: Payer: Self-pay | Admitting: *Deleted

## 2019-11-03 NOTE — Telephone Encounter (Signed)
Pt called back, but had to take a call from [facility]; she requested to be called back; unable to complete transition of care assessment at this time.  Burnard Bunting, RN, BSN, CCRN Patient Engagement Center [phone]

## 2019-11-03 NOTE — Telephone Encounter (Signed)
Medicaid Managed Care team Transition of Care Assessment outreach attempt #1 made today. Unable to reach patient. HIPPA compliant voice message left requesting a return call. The patient has also been enrolled in an automated discharge follow up call series and will receive two outreach attempts for transition of care assessment. Contact information has been left for the patient and the Medicaid Managed Care team is available to provide assistance to the patient at any time.  ° °Katrice Brennyn Haisley, RN, BSN, CCRN °Patient Engagement Center °[phone] ° °

## 2019-11-03 NOTE — Telephone Encounter (Signed)
Contacted pt to complete transition of care assessment:   Transition Care Management Follow-up Telephone Call  . Medicaid Managed Care Transition Call Status:MM [facility] Call Made  . Date of discharge and from where: [facility] 11/01/19  . How have you been since you were released from the hospital? ok  . Any questions or concerns? No  Items Reviewed: Marland Kitchen Did the pt receive and understand the discharge instructions provided? Yes  . Medications obtained and verified? Yes  . Any new allergies since your discharge? No  . Dietary orders reviewed? Yes . Do you have support at home?  Yes, family  Functional Questionnaire: (I = Independent and D = Dependent)  ADLs: Independent Bathing/Dressing:Independent Meal Prep: Independent Eating: Independent Maintaining continence: Independent Transferring/Ambulation: Independent Managing Meds: Independent Follow up appointments reviewed:  PCP Hospital f/u appt confirmed? Yes  Scheduled to see on Dr Scheryl Darter on 11/07/19 @ 0945 Specialist Hospital f/u appt confirmed? yes Scheduled to see  Dr Leroy Libman on 12/10/19 @ 0900  Are transportation arrangements needed? No   If their condition worsens, is the pt aware to call PCP or go to the EmergencyDept.? Yes  Was the patient provided with contact information for the PCP's office or ED? yes  Was to pt encouraged to call back with questions or concerns? Yes  Burnard Bunting, RN, BSN, CCRN Patient Engagement Center [phone]

## 2019-11-07 ENCOUNTER — Ambulatory Visit: Payer: Medicaid Other | Admitting: Obstetrics & Gynecology

## 2019-12-02 ENCOUNTER — Ambulatory Visit: Payer: Medicaid Other | Admitting: Obstetrics and Gynecology

## 2019-12-10 ENCOUNTER — Other Ambulatory Visit: Payer: Self-pay

## 2019-12-10 ENCOUNTER — Other Ambulatory Visit: Payer: Medicaid Other

## 2019-12-10 ENCOUNTER — Ambulatory Visit (INDEPENDENT_AMBULATORY_CARE_PROVIDER_SITE_OTHER): Payer: Medicaid Other | Admitting: Obstetrics and Gynecology

## 2019-12-10 DIAGNOSIS — O0990 Supervision of high risk pregnancy, unspecified, unspecified trimester: Secondary | ICD-10-CM

## 2019-12-10 DIAGNOSIS — R7309 Other abnormal glucose: Secondary | ICD-10-CM

## 2019-12-10 DIAGNOSIS — O133 Gestational [pregnancy-induced] hypertension without significant proteinuria, third trimester: Secondary | ICD-10-CM

## 2019-12-10 DIAGNOSIS — O24419 Gestational diabetes mellitus in pregnancy, unspecified control: Secondary | ICD-10-CM

## 2019-12-10 NOTE — Progress Notes (Signed)
error 

## 2019-12-24 ENCOUNTER — Ambulatory Visit: Payer: Medicaid Other | Admitting: Obstetrics and Gynecology

## 2020-01-01 ENCOUNTER — Ambulatory Visit (INDEPENDENT_AMBULATORY_CARE_PROVIDER_SITE_OTHER): Payer: Medicaid Other | Admitting: Nurse Practitioner

## 2020-01-01 ENCOUNTER — Other Ambulatory Visit: Payer: Self-pay

## 2020-01-01 ENCOUNTER — Other Ambulatory Visit (HOSPITAL_COMMUNITY)
Admission: RE | Admit: 2020-01-01 | Discharge: 2020-01-01 | Disposition: A | Discharge status: Home / Self Care | Payer: Medicaid Other | Source: Ambulatory Visit | Attending: Obstetrics and Gynecology | Admitting: Obstetrics and Gynecology

## 2020-01-01 ENCOUNTER — Encounter: Payer: Self-pay | Admitting: Nurse Practitioner

## 2020-01-01 DIAGNOSIS — N898 Other specified noninflammatory disorders of vagina: Secondary | ICD-10-CM

## 2020-01-01 DIAGNOSIS — Z8632 Personal history of gestational diabetes: Secondary | ICD-10-CM

## 2020-01-01 DIAGNOSIS — Z8759 Personal history of other complications of pregnancy, childbirth and the puerperium: Secondary | ICD-10-CM

## 2020-01-01 DIAGNOSIS — R87612 Low grade squamous intraepithelial lesion on cytologic smear of cervix (LGSIL): Secondary | ICD-10-CM

## 2020-01-01 NOTE — Progress Notes (Signed)
Post Partum Visit Note  Kaitlin [name] is a 29 y.o. [phone] female who presents for a postpartum visit. She is 8 weeks postpartum following a normal spontaneous vaginal delivery.  I have fully reviewed the prenatal and intrapartum course. The delivery was at 37.0 gestational weeks - induction for gestational diabetes on metformin (did not ever fill the metformin).  Anesthesia: epidural. Postpartum course has been unremarkable. Baby is doing well. Baby is feeding by bottle - Similac Advance. Bleeding no bleeding. Bowel function is normal. Bladder function is normal. Patient is sexually active. Contraception method is condoms. Postpartum depression screening: negative.   The pregnancy intention screening data noted above was reviewed. Potential methods of contraception were discussed. The patient elected to proceed with Female Condom.    Edinburgh Postnatal Depression Scale - 01/01/20 1550      Edinburgh Postnatal Depression Scale:  In the Past 7 Days   I have been able to laugh and see the funny side of things. 0    I have looked forward with enjoyment to things. 0    I have blamed myself unnecessarily when things went wrong. 0    I have been anxious or worried for no good reason. 0    I have felt scared or panicky for no good reason. 0    Things have been getting on top of me. 0    I have been so unhappy that I have had difficulty sleeping. 0    I have felt sad or miserable. 0    I have been so unhappy that I have been crying. 0    The thought of harming myself has occurred to me. 0    Edinburgh Postnatal Depression Scale Total 0            The following portions of the patient's history were reviewed and updated as appropriate: allergies, current medications, past family history, past medical history, past social history, past surgical history and problem list.  Review of Systems Pertinent items noted in HPI and remainder of comprehensive ROS otherwise negative.    Objective:    Blood pressure (!) 141/87, pulse 79, weight 174 lb (78.9 kg), last menstrual period 01/26/2019, not currently breastfeeding.  General:  alert, cooperative and no distress   Breasts:  deferred  Lungs: clear to auscultation bilaterally  Heart:  regular rate and rhythm, S1, S2 normal, no murmur, click, rub or gallop  Abdomen: deferred   Vulva:  normal  Vagina: not evaluated and vaginal swab done for vaginal itching and discharge.  declines speculum exam.  Cervix:  not evaluated'  Corpus: not examined  Adnexa:  not evaluated  Rectal Exam: Not performed.        Assessment:    postpartum exam. Pap smear not done at today's visit. Still needs reevaluation by glucola testing for resolution of gestational diabetes. Never took medication prescribed for BP when she left the hospital after delivery and never kept any appointment for BP check postpartum. BP elevated today - needs BP recheck and likely referral to PCP Needs Pap ASAP - declined speculum exam today  Plan:   Essential components of care per ACOG recommendations:  1.  Mood and well being: Patient with negative depression screening today. Reviewed local resources for support.  - Patient does not use tobacco.- hx of drug use? No    2. Infant care and feeding:  -Patient currently breastmilk feeding? Yes  -Social determinants of health (SDOH) reviewed in EPIC. No concerns  3.  Sexuality, contraception and birth spacing - Patient does not want a pregnancy in the next year.  Desired family size is unknown children.  - Reviewed forms of contraception in tiered fashion. Patient desired condoms today.   - Discussed birth spacing of 18 months  4. Sleep and fatigue -Encouraged family/partner/community support of 4 hrs of uninterrupted sleep to help with mood and fatigue  5. Physical Recovery  - Discussed patients delivery and complications - Patient had a 3rd degree laceration, perineal healing reviewed. Patient expressed  understanding - Patient has urinary incontinence? No  - Patient is safe to resume physical and sexual activity  6.  Health Maintenance - Last pap smear done January 2021 and was abnormal with LSIL with positive HPV.  Client was scheduled for colpo.   7. Chronic Disease - IF BP is elevated at visit for 2 hr glucola, then client will need PCP follow up.  Info placed on sticky note  Center for [employer], [employer] [facility]  Nolene Bernheim, RN, MSN, NP-BC Nurse Practitioner, Biochemist, clinical for [employer], [facility] 01/01/2020 4:18 PM

## 2020-01-02 LAB — CERVICOVAGINAL ANCILLARY ONLY
Bacterial Vaginitis (gardnerella): POSITIVE — AB
Candida Glabrata: NEGATIVE
Candida Vaginitis: POSITIVE — AB
Chlamydia: NEGATIVE
Comment: NEGATIVE
Comment: NEGATIVE
Comment: NEGATIVE
Comment: NEGATIVE
Comment: NEGATIVE
Comment: NORMAL
Neisseria Gonorrhea: NEGATIVE
Trichomonas: NEGATIVE

## 2020-01-05 ENCOUNTER — Other Ambulatory Visit: Payer: Self-pay

## 2020-01-05 MED ORDER — FLUCONAZOLE 150 MG PO TABS
150.0000 mg | ORAL_TABLET | Freq: Once | ORAL | 0 refills | Status: AC
Start: 2020-01-05 — End: 2020-01-05

## 2020-01-05 MED ORDER — METRONIDAZOLE 0.75 % VA GEL
1.0000 | Freq: Every day | VAGINAL | 0 refills | Status: DC
Start: 2020-01-05 — End: 2020-01-09

## 2020-01-05 NOTE — Progress Notes (Addendum)
Recent swab shows +Yeast and +BV Provider does not recommend BV treatment at this time Patient adamantly requests to be treated for BV and she prefers the gel

## 2020-01-08 ENCOUNTER — Other Ambulatory Visit: Payer: Medicaid Other

## 2020-01-09 ENCOUNTER — Encounter: Payer: Self-pay | Admitting: Family Medicine

## 2020-01-09 ENCOUNTER — Other Ambulatory Visit: Payer: Self-pay

## 2020-01-09 ENCOUNTER — Ambulatory Visit (INDEPENDENT_AMBULATORY_CARE_PROVIDER_SITE_OTHER): Payer: Medicaid Other | Admitting: Family Medicine

## 2020-01-09 ENCOUNTER — Other Ambulatory Visit: Payer: Medicaid Other

## 2020-01-09 VITALS — BP 134/84 | HR 75 | Wt 175.0 lb

## 2020-01-09 DIAGNOSIS — R87612 Low grade squamous intraepithelial lesion on cytologic smear of cervix (LGSIL): Secondary | ICD-10-CM

## 2020-01-09 DIAGNOSIS — Z8632 Personal history of gestational diabetes: Secondary | ICD-10-CM

## 2020-01-09 DIAGNOSIS — Z8759 Personal history of other complications of pregnancy, childbirth and the puerperium: Secondary | ICD-10-CM

## 2020-01-09 NOTE — Assessment & Plan Note (Signed)
Blood pressure is improved today.

## 2020-01-09 NOTE — Progress Notes (Signed)
   Subjective:    Patient ID: Kaitlin [name] is a 29 y.o. female presenting with Follow-up (Diabetes )  on 01/09/2020  HPI: Patient comes in today for her 2-hour OGTT following a gestational diabetes diagnosis in pregnancy.  Patient had an LGA baby with a significant shoulder dystocia.  The patient returned for her postpartum check last week and was noted to have an elevated blood pressure.  Patient history of gestational hypertension however she had not taken any of her medications and did not follow-up for blood pressure checks in the office.  At her postpartum checkup her blood pressure was elevated in the 140/90 range.  The provider wanted her to have subsequent blood pressure reading today.  Review of Systems  Constitutional: Negative for chills and fever.  Respiratory: Negative for shortness of breath.   Cardiovascular: Negative for chest pain.  Gastrointestinal: Negative for abdominal pain, nausea and vomiting.  Genitourinary: Negative for dysuria.  Skin: Negative for rash.      Objective:    BP 134/84   Pulse 75   Wt 175 lb (79.4 kg)   LMP 01/26/2019 (Approximate)   BMI 30.04 kg/m  Physical Exam Constitutional:      General: She is not in acute distress.    Appearance: She is well-developed.  HENT:     Head: Normocephalic and atraumatic.  Eyes:     General: No scleral icterus. Cardiovascular:     Rate and Rhythm: Normal rate.  Pulmonary:     Effort: Pulmonary effort is normal.  Abdominal:     Palpations: Abdomen is soft.  Musculoskeletal:     Cervical back: Neck supple.  Skin:    General: Skin is warm and dry.  Neurological:     Mental Status: She is alert and oriented to person, place, and time.         Assessment & Plan:   Problem List Items Addressed This Visit      Unprioritized   LGSIL on Pap smear of cervix    Offered Pap smear today, patient declined.  We will schedule her back for colposcopy.      History of gestational diabetes - Primary     For 2 hour OGT today      Relevant Orders   Glucose tolerance, 2 hours   History of gestational hypertension    Blood pressure is improved today.         Total time in review of prior notes, pathology, labs, history taking, review with patient, exam, note writing, discussion of options, plan for next steps, alternatives and risks of treatment: 12 minutes.  Return in about 4 weeks (around 02/06/2020) for colpo.  Reva Bores 01/09/2020 9:34 AM

## 2020-01-09 NOTE — Assessment & Plan Note (Addendum)
For 2 hour OGT today

## 2020-01-09 NOTE — Assessment & Plan Note (Signed)
Offered Pap smear today, patient declined.  We will schedule her back for colposcopy.

## 2020-01-16 LAB — GLUCOSE TOLERANCE, 2 HOURS: Glucose, 2 hour: 101 mg/dL (ref 65–139)

## 2020-02-06 ENCOUNTER — Encounter: Payer: Medicaid Other | Admitting: Obstetrics and Gynecology

## 2020-02-12 DIAGNOSIS — H5213 Myopia, bilateral: Secondary | ICD-10-CM | POA: Diagnosis not present

## 2020-03-27 ENCOUNTER — Other Ambulatory Visit: Payer: Self-pay

## 2020-03-27 ENCOUNTER — Inpatient Hospital Stay (HOSPITAL_COMMUNITY)
Admission: AD | Admit: 2020-03-27 | Discharge: 2020-03-27 | Discharge status: Against Medical Advice | Payer: Medicaid Other | Attending: Obstetrics & Gynecology | Admitting: Obstetrics & Gynecology

## 2020-03-27 ENCOUNTER — Encounter (HOSPITAL_COMMUNITY): Payer: Self-pay | Admitting: Obstetrics & Gynecology

## 2020-03-27 DIAGNOSIS — O26891 Other specified pregnancy related conditions, first trimester: Secondary | ICD-10-CM | POA: Insufficient documentation

## 2020-03-27 DIAGNOSIS — Z3A11 11 weeks gestation of pregnancy: Secondary | ICD-10-CM | POA: Insufficient documentation

## 2020-03-27 DIAGNOSIS — R42 Dizziness and giddiness: Secondary | ICD-10-CM | POA: Diagnosis not present

## 2020-03-27 LAB — URINALYSIS, ROUTINE W REFLEX MICROSCOPIC
Bilirubin Urine: NEGATIVE
Glucose, UA: NEGATIVE mg/dL
Hgb urine dipstick: NEGATIVE
Ketones, ur: NEGATIVE mg/dL
Leukocytes,Ua: NEGATIVE
Nitrite: NEGATIVE
Protein, ur: NEGATIVE mg/dL
Specific Gravity, Urine: 1.015 (ref 1.005–1.030)
pH: 6 (ref 5.0–8.0)

## 2020-03-27 LAB — POCT PREGNANCY, URINE: Preg Test, Ur: POSITIVE — AB

## 2020-03-27 NOTE — MAU Note (Signed)
Kaitlin [name] is a 29 y.o. at [redacted]w[redacted]d here in MAU reporting: dizziness for the past week. For the past month has been having lower pelvic pain. No bleeding.   LMP: 01/04/20  Onset of complaint: ongoing  Pain score: 7/10  Vitals:   03/27/20 0939  BP: 132/87  Pulse: 90  Resp: 16  Temp: 99 F (37.2 C)  SpO2: 100%      Lab orders placed from triage: UA, UPT

## 2020-03-27 NOTE — MAU Note (Signed)
RN went into the room to check the patient in the remainder of the way, pt still in clothes and states she needs to leave since her baby is in the car with her husband. Pt signed AMA form and Rolitta Dawson CNM notified.

## 2020-04-07 ENCOUNTER — Ambulatory Visit: Payer: Medicaid Other | Admitting: Obstetrics & Gynecology

## 2020-04-07 ENCOUNTER — Other Ambulatory Visit: Payer: Self-pay

## 2020-04-07 DIAGNOSIS — O219 Vomiting of pregnancy, unspecified: Secondary | ICD-10-CM

## 2020-04-07 MED ORDER — PROMETHAZINE HCL 25 MG PO TABS: 25.0000 mg | ORAL_TABLET | Freq: Four times a day (QID) | ORAL | 2 refills | Status: DC | PRN

## 2020-04-10 NOTE — L&D Delivery Note (Addendum)
Shoulder Dystocia Delivery Note Pt C/C/crowning at 1800. Dr. Jolayne Panther at [facility] for delivery due to Hx SD Pushed x 2 contractions. Mild 30 second shoulder dystocia encountered. Resolved w/ McRoberts and Suprapubic pressure. At 6:03 PM a viable and healthy female was delivered via Vaginal, Spontaneous.  Presentation: vertex; Position: Right,, Occiput,, Anterior. Delivered through loose nuchal cord x 1. 3VC. Baby placed skin-to-skin w/ mom. APGARS 8, 9. TXA and pitocin bolus given immediately after delivery of baby due to Hx PPH and macrosomia. Delayed cord clamping x 1 minute. Cord clamped x 2 and cut by FOB.   Large gush of blood noted. Attempted delivery of placenta w/ gentle traction but it did not release. Due to bleeding placenta was manually removed in one sweep. Appeared disrupted but no missing fragments. Bleeding quickly slowed. Cytotec 1000 mcg Cytotec given PR and Dr. Jolayne Panther notified of almost 1000 ml blood loss, interventions and good response. FF U/2. Scant bleeding. Perineum inspected, intact. Edematous cervix visible at introitus.    Delivery of the head: 09/19/2020  6:02 PM First maneuver: 09/19/2020  6:03 PM, McRoberts Second maneuver: 09/19/2020  6:03 PM, Suprapubic Pressure  Weight pending.   Anesthesia: Epidural  Episiotomy: None Lacerations: None Suture Repair:  NA Est. Blood Loss (mL): 919  Mom to postpartum.  Baby to Couplet care / Skin to Skin. Placenta to: LD Feeding: Br/Bo Circ: IP Contraception: POPs GHTN: Watch BP PP. Consider antihypertensive agent.  GBS PCR resulted positive after delivery. Pt did NOT receive prophylaxis.   Dorathy Kinsman 09/19/2020, 6:34 PM

## 2020-04-12 ENCOUNTER — Ambulatory Visit (INDEPENDENT_AMBULATORY_CARE_PROVIDER_SITE_OTHER): Payer: Medicaid Other | Admitting: *Deleted

## 2020-04-12 DIAGNOSIS — Z3A00 Weeks of gestation of pregnancy not specified: Secondary | ICD-10-CM

## 2020-04-12 DIAGNOSIS — Z3480 Encounter for supervision of other normal pregnancy, unspecified trimester: Secondary | ICD-10-CM

## 2020-04-12 DIAGNOSIS — O0990 Supervision of high risk pregnancy, unspecified, unspecified trimester: Secondary | ICD-10-CM | POA: Insufficient documentation

## 2020-04-12 NOTE — Progress Notes (Signed)
PRENATAL INTAKE SUMMARY  Ms. Kapral presents today New OB Nurse Interview.  OB History    Gravida  6   Para  5   Term  4   Preterm  1   AB      Living  5     SAB      IAB      Ectopic      Multiple      Live Births  5          I have reviewed the patient's medical, obstetrical, social, and family histories, medications, and available lab results.  SUBJECTIVE She has no unusual complaints  OBJECTIVE Initial Intake Exam (New OB)  GENERAL APPEARANCE: Sounds well via televisit.   ASSESSMENT Normal pregnancy States doing well with Phenergan  PLAN Prenatal care at [facility]

## 2020-04-12 NOTE — Progress Notes (Signed)
Patient was assessed and managed by nursing staff during this encounter. I have reviewed the chart and agree with the documentation and plan. I have also made any necessary editorial changes.  Catalina Antigua, MD 04/12/2020 2:47 PM

## 2020-04-21 ENCOUNTER — Encounter: Payer: Self-pay | Admitting: Obstetrics

## 2020-04-21 ENCOUNTER — Other Ambulatory Visit (HOSPITAL_COMMUNITY)
Admission: RE | Admit: 2020-04-21 | Discharge: 2020-04-21 | Disposition: A | Discharge status: Home / Self Care | Payer: Medicaid Other | Source: Ambulatory Visit | Attending: Obstetrics | Admitting: Obstetrics

## 2020-04-21 ENCOUNTER — Ambulatory Visit (INDEPENDENT_AMBULATORY_CARE_PROVIDER_SITE_OTHER): Payer: Medicaid Other | Admitting: Obstetrics

## 2020-04-21 ENCOUNTER — Other Ambulatory Visit: Payer: Self-pay

## 2020-04-21 VITALS — BP 111/76 | HR 97 | Wt 183.6 lb

## 2020-04-21 DIAGNOSIS — R87612 Low grade squamous intraepithelial lesion on cytologic smear of cervix (LGSIL): Secondary | ICD-10-CM

## 2020-04-21 DIAGNOSIS — Z3A15 15 weeks gestation of pregnancy: Secondary | ICD-10-CM | POA: Diagnosis not present

## 2020-04-21 DIAGNOSIS — Z3482 Encounter for supervision of other normal pregnancy, second trimester: Secondary | ICD-10-CM

## 2020-04-21 DIAGNOSIS — Z8632 Personal history of gestational diabetes: Secondary | ICD-10-CM

## 2020-04-21 DIAGNOSIS — Z31430 Encounter of female for testing for genetic disease carrier status for procreative management: Secondary | ICD-10-CM | POA: Diagnosis not present

## 2020-04-21 DIAGNOSIS — O0990 Supervision of high risk pregnancy, unspecified, unspecified trimester: Secondary | ICD-10-CM | POA: Insufficient documentation

## 2020-04-21 DIAGNOSIS — Z8759 Personal history of other complications of pregnancy, childbirth and the puerperium: Secondary | ICD-10-CM

## 2020-04-21 DIAGNOSIS — O09899 Supervision of other high risk pregnancies, unspecified trimester: Secondary | ICD-10-CM

## 2020-04-21 MED ORDER — ASPIRIN 81 MG PO CHEW: 81.0000 mg | CHEWABLE_TABLET | Freq: Every day | ORAL | 6 refills | Status: DC

## 2020-04-21 MED ORDER — BLOOD PRESSURE KIT DEVI: 1.0000 | 0 refills | Status: AC

## 2020-04-21 NOTE — Progress Notes (Signed)
Subjective:    Kaitlin [name] is being seen today for her first obstetrical visit.  This is not a planned pregnancy. She is at 31w3dgestation. Her obstetrical history is significant for shoulder dystocia, postpartum hemorrhage, large for gestational age, macrosomia, obesity, pregnancy induced hypertension and gestational diabetes. Relationship with FOB: significant other, not living together. Patient does intend to breast and bottle feed. Pregnancy history fully reviewed.  The information documented in the HPI was reviewed and verified.  Menstrual History: OB History    Gravida  6   Para  5   Term  4   Preterm  1   AB      Living  5     SAB      IAB      Ectopic      Multiple      Live Births  5            Patient's last menstrual period was 01/04/2020.    Past Medical History:  Diagnosis Date  . Gestational diabetes     Past Surgical History:  [place]  [place]    (Not in a hospital admission)  Allergies  Allergen Reactions  . Skin Bleaching [Hydroquinone]     Social History   Tobacco Use  . Smoking status: Never Smoker  . Smokeless tobacco: Never Used  Substance Use Topics  . Alcohol use: Never    Family History  Problem Relation Age of Onset  . Hypertension Mother      Review of Systems Constitutional: negative for weight loss Gastrointestinal: negative for vomiting Genitourinary:negative for genital lesions and vaginal discharge and dysuria Musculoskeletal:negative for back pain Behavioral/Psych: negative for abusive relationship, depression, illegal drug usage and tobacco use    Objective:    BP 111/76   Pulse 97   Wt 183 lb 9.6 oz (83.3 kg)   LMP 01/04/2020   BMI 31.51 kg/m  General Appearance:    Alert, cooperative, no distress, appears stated age  Head:    Normocephalic, without obvious abnormality, atraumatic  Eyes:    PERRL, conjunctiva/corneas clear, EOM's intact, fundi    benign,  both eyes  Ears:    Normal TM's and external ear canals, both ears  Nose:   Nares normal, septum midline, mucosa normal, no drainage    or sinus tenderness  Throat:   Lips, mucosa, and tongue normal; teeth and gums normal  Neck:   Supple, symmetrical, trachea midline, no adenopathy;    thyroid:  no enlargement/tenderness/nodules; no carotid   bruit or JVD  Back:     Symmetric, no curvature, ROM normal, no CVA tenderness  Lungs:     Clear to auscultation bilaterally, respirations unlabored  Chest Wall:    No tenderness or deformity   Heart:    Regular rate and rhythm, S1 and S2 normal, no murmur, rub   or gallop  Breast Exam:    No tenderness, masses, or nipple abnormality  Abdomen:     Soft, non-tender, bowel sounds active all four quadrants,    no masses, no organomegaly  Genitalia:    Normal female without lesion, discharge or tenderness  Extremities:   Extremities normal, atraumatic, no cyanosis or edema  Pulses:   2+ and symmetric all extremities  Skin:   Skin color, texture, turgor normal, no rashes or lesions  Lymph nodes:   Cervical, supraclavicular, and axillary nodes normal  Neurologic:   CNII-XII intact, normal strength, sensation and reflexes  throughout      Lab Review Urine pregnancy test Labs reviewed yes Radiologic studies reviewed no  Assessment:    Pregnancy at 17w3dweeks    Plan:     1. Supervision of high risk pregnancy, antepartum Rx: - Blood Pressure Monitoring (BLOOD PRESSURE KIT) DEVI; 1 kit by Does not apply route once a week.  Dispense: 1 each; Refill: 0 - Enroll Patient in Babyscripts - Cervicovaginal ancillary only( [facility]) - CBC/D/Plt+RPR+Rh+ABO+Rub Ab... - Genetic Screening - [place] OB COMP + 14 WK; Future  2. History of preterm delivery, currently pregnant  3. History of gestational diabetes  4. History of gestational hypertension Rx: - aspirin 81 MG chewable tablet; Chew 1 tablet (81 mg total) by mouth daily.  Dispense: 30  tablet; Refill: 6  5. History of shoulder dystocia in prior pregnancy  6. History of postpartum hemorrhage  7. LGSIL on Pap smear of cervix - repeat pap 3-4 months postpartum   Prenatal vitamins.  Counseling provided regarding continued use of seat belts, cessation of alcohol consumption, smoking or use of illicit drugs; infection precautions i.e., influenza/TDAP immunizations, toxoplasmosis,CMV, parvovirus, listeria and varicella; workplace safety, exercise during pregnancy; routine dental care, safe medications, sexual activity, hot tubs, saunas, pools, travel, caffeine use, fish and methlymercury, potential toxins, hair treatments, varicose veins Weight gain recommendations per IOM guidelines reviewed: underweight/BMI< 18.5--> gain 28 - 40 lbs; normal weight/BMI 18.5 - 24.9--> gain 25 - 35 lbs; overweight/BMI 25 - 29.9--> gain 15 - 25 lbs; obese/BMI >30->gain  11 - 20 lbs Problem list reviewed and updated. FIRST/CF mutation testing/NIPT/QUAD SCREEN/fragile X/Ashkenazi Jewish population testing/Spinal muscular atrophy discussed: requested. Role of ultrasound in pregnancy discussed; fetal survey: requested. Amniocentesis discussed: not indicated.  Meds ordered this encounter  Medications  . Blood Pressure Monitoring (BLOOD PRESSURE KIT) DEVI    Sig: 1 kit by Does not apply route once a week.    Dispense:  1 each    Refill:  0   Orders Placed This Encounter  Procedures  . CBC/D/Plt+RPR+Rh+ABO+Rub Ab...  . Genetic Screening    Follow up in 4 weeks. 50% of 20 min visit spent on counseling and coordination of care.    HShelly Bombard MD 04/21/2020 10:58 AM

## 2020-04-21 NOTE — Addendum Note (Signed)
Addended by: Cheree Ditto, Erynne Kealey A on: 04/21/2020 02:39 PM   Modules accepted: Orders

## 2020-04-21 NOTE — Progress Notes (Signed)
Patient presents for New OB. Patient complains of having vaginal burning and irritation, no odor. This was not a planned delivery. She does not live with FOB. Plans to breast and bottle feed.

## 2020-04-22 ENCOUNTER — Other Ambulatory Visit: Payer: Self-pay | Admitting: Obstetrics

## 2020-04-22 ENCOUNTER — Telehealth: Payer: Self-pay

## 2020-04-22 DIAGNOSIS — B373 Candidiasis of vulva and vagina: Secondary | ICD-10-CM

## 2020-04-22 DIAGNOSIS — N760 Acute vaginitis: Secondary | ICD-10-CM

## 2020-04-22 DIAGNOSIS — O99019 Anemia complicating pregnancy, unspecified trimester: Secondary | ICD-10-CM

## 2020-04-22 DIAGNOSIS — B3731 Acute candidiasis of vulva and vagina: Secondary | ICD-10-CM

## 2020-04-22 LAB — CBC/D/PLT+RPR+RH+ABO+RUB AB...
Antibody Screen: NEGATIVE
Basophils Absolute: 0 10*3/uL (ref 0.0–0.2)
Basos: 0 %
EOS (ABSOLUTE): 0 10*3/uL (ref 0.0–0.4)
Eos: 1 %
HCV Ab: <0.1 s/co ratio (ref 0.0–0.9)
HIV Screen 4th Generation wRfx: NONREACTIVE
Hematocrit: 32.8 % — ABNORMAL LOW (ref 34.0–46.6)
Hemoglobin: 10.5 g/dL — ABNORMAL LOW (ref 11.1–15.9)
Hepatitis B Surface Ag: NEGATIVE
Immature Grans (Abs): 0 10*3/uL (ref 0.0–0.1)
Immature Granulocytes: 0 %
Lymphocytes Absolute: 1.8 10*3/uL (ref 0.7–3.1)
Lymphs: 43 %
MCH: 21.9 pg — ABNORMAL LOW (ref 26.6–33.0)
MCHC: 32 g/dL (ref 31.5–35.7)
MCV: 68 fL — ABNORMAL LOW (ref 79–97)
Monocytes Absolute: 0.3 10*3/uL (ref 0.1–0.9)
Monocytes: 8 %
Neutrophils Absolute: 2 10*3/uL (ref 1.4–7.0)
Neutrophils: 48 %
Platelets: 247 10*3/uL (ref 150–450)
RBC: 4.8 x10E6/uL (ref 3.77–5.28)
RDW: 15.9 % — ABNORMAL HIGH (ref 11.7–15.4)
RPR Ser Ql: NONREACTIVE
Rh Factor: POSITIVE
Rubella Antibodies, IGG: 4.63 index (ref >0.99)
WBC: 4.2 10*3/uL (ref 3.4–10.8)

## 2020-04-22 LAB — CERVICOVAGINAL ANCILLARY ONLY
Bacterial Vaginitis (gardnerella): POSITIVE — AB
Candida Glabrata: NEGATIVE
Candida Vaginitis: POSITIVE — AB
Chlamydia: NEGATIVE
Comment: NEGATIVE
Comment: NEGATIVE
Comment: NEGATIVE
Comment: NEGATIVE
Comment: NEGATIVE
Comment: NORMAL
Neisseria Gonorrhea: NEGATIVE
Trichomonas: NEGATIVE

## 2020-04-22 LAB — HCV INTERPRETATION

## 2020-04-22 MED ORDER — METRONIDAZOLE 500 MG PO TABS: 500.0000 mg | ORAL_TABLET | Freq: Two times a day (BID) | ORAL | 2 refills | Status: DC

## 2020-04-22 MED ORDER — FERROUS SULFATE 325 (65 FE) MG PO TABS: 325.0000 mg | ORAL_TABLET | ORAL | 5 refills | Status: DC

## 2020-04-22 MED ORDER — TERCONAZOLE 0.4 % VA CREA: 1.0000 | TOPICAL_CREAM | Freq: Every day | VAGINAL | 0 refills | Status: DC

## 2020-04-22 NOTE — Telephone Encounter (Signed)
-----   Message from Brock Bad, MD sent at 04/22/2020  2:53 PM EST ----- Flagyl Rx for BV Terazol 7 Rx for yeast

## 2020-04-22 NOTE — Telephone Encounter (Signed)
Call patient to inform her of test results. LMTCB 

## 2020-04-27 ENCOUNTER — Encounter: Payer: Self-pay | Admitting: Obstetrics

## 2020-05-05 ENCOUNTER — Encounter: Payer: Self-pay | Admitting: Obstetrics

## 2020-05-10 ENCOUNTER — Encounter: Payer: Self-pay | Admitting: Obstetrics

## 2020-05-17 ENCOUNTER — Ambulatory Visit: Payer: Medicaid Other

## 2020-05-20 ENCOUNTER — Encounter: Payer: Medicaid Other | Admitting: Obstetrics

## 2020-05-20 ENCOUNTER — Encounter: Payer: Medicaid Other | Admitting: Obstetrics and Gynecology

## 2020-05-24 ENCOUNTER — Ambulatory Visit (INDEPENDENT_AMBULATORY_CARE_PROVIDER_SITE_OTHER): Payer: Medicaid Other | Admitting: Obstetrics and Gynecology

## 2020-05-24 ENCOUNTER — Other Ambulatory Visit: Payer: Self-pay

## 2020-05-24 VITALS — BP 134/86 | HR 88 | Wt 187.3 lb

## 2020-05-24 DIAGNOSIS — Z8632 Personal history of gestational diabetes: Secondary | ICD-10-CM

## 2020-05-24 DIAGNOSIS — Z3480 Encounter for supervision of other normal pregnancy, unspecified trimester: Secondary | ICD-10-CM

## 2020-05-24 DIAGNOSIS — Z3A20 20 weeks gestation of pregnancy: Secondary | ICD-10-CM | POA: Insufficient documentation

## 2020-05-24 DIAGNOSIS — Z8759 Personal history of other complications of pregnancy, childbirth and the puerperium: Secondary | ICD-10-CM

## 2020-05-24 DIAGNOSIS — Z8751 Personal history of pre-term labor: Secondary | ICD-10-CM

## 2020-05-24 NOTE — Patient Instructions (Signed)
Hypertension During Pregnancy Hypertension is also called high blood pressure. High blood pressure means that the force of the blood moving in your body is high enough to cause problems for you and your baby. Different types of high blood pressure can happen during pregnancy. The types are:  High blood pressure before you got pregnant. This is called chronic hypertension.  This can continue during your pregnancy. Your doctor will want to keep checking your blood pressure. You may need medicine to control your blood pressure while you are pregnant. You will need follow-up visits after you have your baby.  High blood pressure that goes up during pregnancy when it was normal before. This is called gestational hypertension. It will often get better after you have your baby, but your doctor will need to watch your blood pressure to make sure that it is getting better.  You may develop high blood pressure after giving birth. This is called postpartum hypertension. This often occurs within 48 hours after childbirth but may occur up to 6 weeks after giving birth. Very high blood pressure during pregnancy is an emergency that needs treatment right away. How does this affect me? If you have high blood pressure during pregnancy, you have a higher chance of developing high blood pressure:  As you get older.  If you get pregnant again. In some cases, high blood pressure during pregnancy can cause:  Stroke.  Heart attack.  Damage to the kidneys, lungs, or liver.  Preeclampsia.  HELLP syndrome.  Seizures.  Problems with the placenta. How does this affect my baby? Your baby may:  Be born early.  Not weigh as much as he or she should.  Not handle labor well, leading to a C-section. This condition may also result in a baby's death before birth (stillbirth). What are the risks?  Having high blood pressure during a past pregnancy.  Being overweight.  Being age 35 or older.  Being pregnant  for the first time.  Being pregnant with more than one baby.  Becoming pregnant using fertility methods, such as IVF.  Having other problems, such as diabetes or kidney disease. What can I do to lower my risk?  Keep a healthy weight.  Eat a healthy diet.  Follow what your doctor tells you about treating any medical problems that you had before you got pregnant. It is very important to go to all of your doctor visits. Your doctor will check your blood pressure and make sure that your pregnancy is progressing as it should. Treatment should start early if a problem is found.   How is this treated? Treatment for high blood pressure during pregnancy can vary. It depends on the type of high blood pressure you have and how serious it is.  If you were taking medicine for your blood pressure before you got pregnant, talk with your doctor. You may need to change the medicine during pregnancy if it is not safe for your baby.  If your blood pressure goes up during pregnancy, your doctor may order medicine to treat this.  If you are at risk for preeclampsia, your doctor may tell you to take a low-dose aspirin while you are pregnant.  If you have very high blood pressure, you may need to stay in the hospital so you and your baby can be watched closely. You may also need to take medicine to lower your blood pressure.  In some cases, if your condition gets worse, you may need to have your baby early.   Follow these instructions at home: Eating and drinking  Drink enough fluid to keep your pee (urine) pale yellow.  Avoid caffeine.   Lifestyle  Do not smoke or use any products that contain nicotine or tobacco. If you need help quitting, ask your doctor.  Do not use alcohol or drugs.  Avoid stress.  Rest and get plenty of sleep.  Regular exercise can help. Ask your doctor what kinds of exercise are best for you. General instructions  Take over-the-counter and prescription medicines only as  told by your doctor.  Keep all prenatal and follow-up visits. Contact a doctor if:  You have symptoms that your doctor told you to watch for, such as: ? Headaches. ? A feeling like you may vomit (nausea). ? Vomiting. ? Belly (abdominal) pain. ? Feeling dizzy or light-headed. Get help right away if:  You have symptoms of serious problems, such as: ? Very bad belly pain that does not get better with treatment. ? A very bad headache that does not get better. ? Blurry vision. ? Double vision. ? Vomiting that does not get better. ? Sudden, fast weight gain. ? Sudden swelling in your hands, ankles, or face. ? Bleeding from your vagina. ? Blood in your pee. ? Shortness of breath. ? Chest pain. ? Weakness on one side of your body. ? Trouble talking.  Your baby is not moving as much as usual. These symptoms may be an emergency. Get help right away. Call your local emergency services (911 in the [place]  Do not wait to see if the symptoms will go away.  Do not drive yourself to the hospital. Summary  High blood pressure is also called hypertension.  High blood pressure means that the force of the blood moving in your body is high enough to cause problems for you and your baby.  Get help right away if you have symptoms of serious problems due to high blood pressure.  Keep all prenatal and follow-up visits. This information is not intended to replace advice given to you by your health care provider. Make sure you discuss any questions you have with your health care provider. Document Revised: 12/18/2019 Document Reviewed: 12/18/2019 Elsevier Patient Education  2021 Elsevier Inc.  

## 2020-05-24 NOTE — Progress Notes (Signed)
Patient presents for ROB. Patient has no concerns today. 

## 2020-05-24 NOTE — Progress Notes (Signed)
   PRENATAL VISIT NOTE  Subjective:  Kaitlin [name] is a 30 y.o. [phone] at [redacted]w[redacted]d being seen today for ongoing prenatal care.  She is currently monitored for the following issues for this high-risk pregnancy and has History of preterm delivery; LGSIL on Pap smear of cervix; Migraine; History of gestational diabetes; History of gestational hypertension; History of shoulder dystocia in prior pregnancy; History of postpartum hemorrhage; Supervision of other normal pregnancy, antepartum; and [redacted] weeks gestation of pregnancy on their problem list.  Patient doing well with no acute concerns today. She reports no complaints.  Contractions: Not present. Vag. Bleeding: None.  Movement: Present. Denies leaking of fluid.   Pt advised to resume taking daily baby ASA.  The following portions of the patient's history were reviewed and updated as appropriate: allergies, current medications, past family history, past medical history, past social history, past surgical history and problem list. Problem list updated.  Objective:   Vitals:   05/24/20 1036  BP: 134/86  Pulse: 88  Weight: 187 lb 4.8 oz (85 kg)    Fetal Status: Fetal Heart Rate (bpm): 150 Fundal Height: 20 cm Movement: Present     General:  Alert, oriented and cooperative. Patient is in no acute distress.  Skin: Skin is warm and dry. No rash noted.   Cardiovascular: Normal heart rate noted  Respiratory: Normal respiratory effort, no problems with respiration noted  Abdomen: Soft, gravid, appropriate for gestational age.  Pain/Pressure: Absent     Pelvic: Cervical exam deferred        Extremities: Normal range of motion.  Edema: None  Mental Status:  Normal mood and affect. Normal behavior. Normal judgment and thought content.   Assessment and Plan:  Pregnancy: [id number] at [redacted]w[redacted]d  1. [redacted] weeks gestation of pregnancy   2. History of preterm delivery Pt previously delivered at 36 week, may have been due to elevated BP  3. History of  gestational diabetes 28 week testing  4. History of gestational hypertension Monitor BP closely  5. History of shoulder dystocia in prior pregnancy Monitor fetal growth  6. History of postpartum hemorrhage Treat after delivery, consider TXA  7. Supervision of other normal pregnancy, antepartum   Preterm labor symptoms and general obstetric precautions including but not limited to vaginal bleeding, contractions, leaking of fluid and fetal movement were reviewed in detail with the patient.  Please refer to After Visit Summary for other counseling recommendations.   Return in about 4 weeks (around 06/21/2020) for [facility], in person.   Mariel Aloe, MD Faculty Attending Center for [facility]

## 2020-05-31 ENCOUNTER — Ambulatory Visit: Payer: Medicaid Other | Admitting: *Deleted

## 2020-05-31 ENCOUNTER — Encounter: Payer: Self-pay | Admitting: *Deleted

## 2020-05-31 ENCOUNTER — Other Ambulatory Visit: Payer: Self-pay | Admitting: *Deleted

## 2020-05-31 ENCOUNTER — Other Ambulatory Visit: Payer: Self-pay

## 2020-05-31 ENCOUNTER — Ambulatory Visit: Payer: Medicaid Other | Attending: Obstetrics

## 2020-05-31 DIAGNOSIS — D696 Thrombocytopenia, unspecified: Secondary | ICD-10-CM | POA: Diagnosis not present

## 2020-05-31 DIAGNOSIS — Z363 Encounter for antenatal screening for malformations: Secondary | ICD-10-CM | POA: Diagnosis not present

## 2020-05-31 DIAGNOSIS — O99112 Other diseases of the blood and blood-forming organs and certain disorders involving the immune mechanism complicating pregnancy, second trimester: Secondary | ICD-10-CM

## 2020-05-31 DIAGNOSIS — O09292 Supervision of pregnancy with other poor reproductive or obstetric history, second trimester: Secondary | ICD-10-CM

## 2020-05-31 DIAGNOSIS — O99212 Obesity complicating pregnancy, second trimester: Secondary | ICD-10-CM | POA: Diagnosis not present

## 2020-05-31 DIAGNOSIS — O0992 Supervision of high risk pregnancy, unspecified, second trimester: Secondary | ICD-10-CM

## 2020-05-31 DIAGNOSIS — Z148 Genetic carrier of other disease: Secondary | ICD-10-CM | POA: Diagnosis not present

## 2020-05-31 DIAGNOSIS — O09212 Supervision of pregnancy with history of pre-term labor, second trimester: Secondary | ICD-10-CM

## 2020-05-31 DIAGNOSIS — Z3480 Encounter for supervision of other normal pregnancy, unspecified trimester: Secondary | ICD-10-CM | POA: Insufficient documentation

## 2020-05-31 DIAGNOSIS — Z3A21 21 weeks gestation of pregnancy: Secondary | ICD-10-CM

## 2020-05-31 DIAGNOSIS — O09892 Supervision of other high risk pregnancies, second trimester: Secondary | ICD-10-CM | POA: Diagnosis not present

## 2020-05-31 DIAGNOSIS — Z8759 Personal history of other complications of pregnancy, childbirth and the puerperium: Secondary | ICD-10-CM

## 2020-05-31 DIAGNOSIS — O0990 Supervision of high risk pregnancy, unspecified, unspecified trimester: Secondary | ICD-10-CM | POA: Diagnosis not present

## 2020-06-15 ENCOUNTER — Other Ambulatory Visit: Payer: Self-pay

## 2020-06-15 DIAGNOSIS — O219 Vomiting of pregnancy, unspecified: Secondary | ICD-10-CM

## 2020-06-15 MED ORDER — PROMETHAZINE HCL 25 MG PO TABS: 25.0000 mg | ORAL_TABLET | Freq: Four times a day (QID) | ORAL | 0 refills | Status: DC | PRN

## 2020-06-15 NOTE — Progress Notes (Signed)
Rx sent per protocol for Nausea and vomiting.  Pt made aware and voiced understanding.

## 2020-06-21 ENCOUNTER — Encounter: Payer: Medicaid Other | Admitting: Obstetrics and Gynecology

## 2020-06-28 ENCOUNTER — Encounter: Payer: Medicaid Other | Admitting: Obstetrics & Gynecology

## 2020-07-02 ENCOUNTER — Other Ambulatory Visit (HOSPITAL_COMMUNITY)
Admission: RE | Admit: 2020-07-02 | Discharge: 2020-07-02 | Disposition: A | Discharge status: Home / Self Care | Payer: Medicaid Other | Source: Ambulatory Visit | Attending: Obstetrics & Gynecology | Admitting: Obstetrics & Gynecology

## 2020-07-02 ENCOUNTER — Other Ambulatory Visit: Payer: Self-pay

## 2020-07-02 ENCOUNTER — Ambulatory Visit (INDEPENDENT_AMBULATORY_CARE_PROVIDER_SITE_OTHER): Payer: Medicaid Other | Admitting: Obstetrics & Gynecology

## 2020-07-02 VITALS — BP 129/81 | HR 103 | Wt 192.0 lb

## 2020-07-02 DIAGNOSIS — Z3480 Encounter for supervision of other normal pregnancy, unspecified trimester: Secondary | ICD-10-CM

## 2020-07-02 DIAGNOSIS — N898 Other specified noninflammatory disorders of vagina: Secondary | ICD-10-CM

## 2020-07-02 DIAGNOSIS — R87612 Low grade squamous intraepithelial lesion on cytologic smear of cervix (LGSIL): Secondary | ICD-10-CM

## 2020-07-02 NOTE — Patient Instructions (Signed)

## 2020-07-02 NOTE — Progress Notes (Signed)
   PRENATAL VISIT NOTE  Subjective:  Kaitlin [name] is a 30 y.o. [phone] at [redacted]w[redacted]d being seen today for ongoing prenatal care.  She is currently monitored for the following issues for this high-risk pregnancy and has History of preterm delivery; LGSIL on Pap smear of cervix; Migraine; History of gestational diabetes; History of gestational hypertension; History of shoulder dystocia in prior pregnancy; History of postpartum hemorrhage; and Supervision of other normal pregnancy, antepartum on their problem list.  Patient reports vaginal irritation.  Contractions: Not present. Vag. Bleeding: None.  Movement: Present. Denies leaking of fluid.   The following portions of the patient's history were reviewed and updated as appropriate: allergies, current medications, past family history, past medical history, past social history, past surgical history and problem list.   Objective:   Vitals:   07/02/20 0938  BP: 129/81  Pulse: (!) 103  Weight: 192 lb (87.1 kg)    Fetal Status: Fetal Heart Rate (bpm): 153   Movement: Present     General:  Alert, oriented and cooperative. Patient is in no acute distress.  Skin: Skin is warm and dry. No rash noted.   Cardiovascular: Normal heart rate noted  Respiratory: Normal respiratory effort, no problems with respiration noted  Abdomen: Soft, gravid, appropriate for gestational age.  Pain/Pressure: Absent     Pelvic: Cervical exam deferred        Extremities: Normal range of motion.  Edema: None  Mental Status: Normal mood and affect. Normal behavior. Normal judgment and thought content.   Assessment and Plan:  Pregnancy: [id number] at [redacted]w[redacted]d 1. Supervision of other normal pregnancy, antepartum Vaginal self swab with vaginal itching  2. LGSIL on Pap smear of cervix 04/2020, recommend colpo PP   Preterm labor symptoms and general obstetric precautions including but not limited to vaginal bleeding, contractions, leaking of fluid and fetal movement were  reviewed in detail with the patient. Please refer to After Visit Summary for other counseling recommendations.   Return in about 2 weeks (around 07/16/2020) for 2 hr GTT.  Future Appointments  Date Time Provider Department Center  07/12/2020  9:45 AM [facility] NURSE [facility]  07/12/2020 10:00 AM [facility] US1 [facility]  07/19/2020  9:00 AM [facility] LAB [facility] None  07/19/2020  9:15 AM Constant, Gigi Gin, MD [facility] None    Scheryl Darter, MD

## 2020-07-02 NOTE — Progress Notes (Signed)
Pt reports fetal movement, denies pain.  

## 2020-07-05 LAB — CERVICOVAGINAL ANCILLARY ONLY
Bacterial Vaginitis (gardnerella): POSITIVE — AB
Candida Glabrata: NEGATIVE
Candida Vaginitis: POSITIVE — AB
Comment: NEGATIVE
Comment: NEGATIVE
Comment: NEGATIVE

## 2020-07-08 ENCOUNTER — Other Ambulatory Visit: Payer: Self-pay | Admitting: Obstetrics & Gynecology

## 2020-07-08 DIAGNOSIS — N898 Other specified noninflammatory disorders of vagina: Secondary | ICD-10-CM

## 2020-07-08 MED ORDER — METRONIDAZOLE 500 MG PO TABS: 500.0000 mg | ORAL_TABLET | Freq: Two times a day (BID) | ORAL | 0 refills | Status: DC

## 2020-07-08 MED ORDER — FLUCONAZOLE 150 MG PO TABS: 150.0000 mg | ORAL_TABLET | Freq: Once | ORAL | 0 refills | Status: AC

## 2020-07-08 NOTE — Progress Notes (Signed)
  Meds ordered this encounter  Medications  . metroNIDAZOLE (FLAGYL) 500 MG tablet    Sig: Take 1 tablet (500 mg total) by mouth 2 (two) times daily.    Dispense:  14 tablet    Refill:  0  . fluconazole (DIFLUCAN) 150 MG tablet    Sig: Take 1 tablet (150 mg total) by mouth once for 1 dose.    Dispense:  1 tablet    Refill:  0

## 2020-07-12 ENCOUNTER — Ambulatory Visit: Payer: Medicaid Other | Attending: Obstetrics | Admitting: *Deleted

## 2020-07-12 ENCOUNTER — Encounter: Payer: Self-pay | Admitting: *Deleted

## 2020-07-12 ENCOUNTER — Other Ambulatory Visit: Payer: Self-pay

## 2020-07-12 ENCOUNTER — Other Ambulatory Visit: Payer: Self-pay | Admitting: *Deleted

## 2020-07-12 ENCOUNTER — Ambulatory Visit (HOSPITAL_BASED_OUTPATIENT_CLINIC_OR_DEPARTMENT_OTHER): Payer: Medicaid Other

## 2020-07-12 DIAGNOSIS — Z8632 Personal history of gestational diabetes: Secondary | ICD-10-CM | POA: Diagnosis not present

## 2020-07-12 DIAGNOSIS — D696 Thrombocytopenia, unspecified: Secondary | ICD-10-CM

## 2020-07-12 DIAGNOSIS — Z3A27 27 weeks gestation of pregnancy: Secondary | ICD-10-CM | POA: Insufficient documentation

## 2020-07-12 DIAGNOSIS — Z362 Encounter for other antenatal screening follow-up: Secondary | ICD-10-CM

## 2020-07-12 DIAGNOSIS — Z8759 Personal history of other complications of pregnancy, childbirth and the puerperium: Secondary | ICD-10-CM

## 2020-07-12 DIAGNOSIS — O09212 Supervision of pregnancy with history of pre-term labor, second trimester: Secondary | ICD-10-CM

## 2020-07-12 DIAGNOSIS — Z3480 Encounter for supervision of other normal pregnancy, unspecified trimester: Secondary | ICD-10-CM

## 2020-07-12 DIAGNOSIS — O09892 Supervision of other high risk pregnancies, second trimester: Secondary | ICD-10-CM | POA: Diagnosis not present

## 2020-07-12 DIAGNOSIS — O99112 Other diseases of the blood and blood-forming organs and certain disorders involving the immune mechanism complicating pregnancy, second trimester: Secondary | ICD-10-CM | POA: Diagnosis not present

## 2020-07-12 DIAGNOSIS — O350XX0 Maternal care for (suspected) central nervous system malformation in fetus, not applicable or unspecified: Secondary | ICD-10-CM

## 2020-07-12 DIAGNOSIS — O09292 Supervision of pregnancy with other poor reproductive or obstetric history, second trimester: Secondary | ICD-10-CM | POA: Insufficient documentation

## 2020-07-12 DIAGNOSIS — Z148 Genetic carrier of other disease: Secondary | ICD-10-CM

## 2020-07-12 DIAGNOSIS — O09299 Supervision of pregnancy with other poor reproductive or obstetric history, unspecified trimester: Secondary | ICD-10-CM | POA: Diagnosis present

## 2020-07-12 DIAGNOSIS — O3509X0 Maternal care for (suspected) other central nervous system malformation or damage in fetus, not applicable or unspecified: Secondary | ICD-10-CM

## 2020-07-12 DIAGNOSIS — O99212 Obesity complicating pregnancy, second trimester: Secondary | ICD-10-CM | POA: Insufficient documentation

## 2020-07-12 DIAGNOSIS — E669 Obesity, unspecified: Secondary | ICD-10-CM | POA: Diagnosis not present

## 2020-07-19 ENCOUNTER — Other Ambulatory Visit: Payer: Medicaid Other

## 2020-07-19 ENCOUNTER — Encounter: Payer: Medicaid Other | Admitting: Obstetrics and Gynecology

## 2020-07-22 ENCOUNTER — Other Ambulatory Visit: Payer: Self-pay

## 2020-07-22 DIAGNOSIS — O219 Vomiting of pregnancy, unspecified: Secondary | ICD-10-CM

## 2020-07-22 MED ORDER — PROMETHAZINE HCL 25 MG PO TABS: 25.0000 mg | ORAL_TABLET | Freq: Four times a day (QID) | ORAL | 0 refills | Status: DC | PRN

## 2020-07-22 NOTE — Progress Notes (Signed)
Pt called about refill for phenergan, sent with provider approval.

## 2020-07-27 ENCOUNTER — Encounter: Payer: Medicaid Other | Admitting: Obstetrics and Gynecology

## 2020-07-27 ENCOUNTER — Other Ambulatory Visit: Payer: Medicaid Other

## 2020-08-09 ENCOUNTER — Encounter: Payer: Self-pay | Admitting: Obstetrics and Gynecology

## 2020-08-09 ENCOUNTER — Ambulatory Visit (INDEPENDENT_AMBULATORY_CARE_PROVIDER_SITE_OTHER): Payer: Medicaid Other | Admitting: Obstetrics and Gynecology

## 2020-08-09 ENCOUNTER — Other Ambulatory Visit: Payer: Medicaid Other

## 2020-08-09 ENCOUNTER — Other Ambulatory Visit: Payer: Self-pay

## 2020-08-09 VITALS — BP 131/91 | HR 114 | Wt 193.0 lb

## 2020-08-09 DIAGNOSIS — O24419 Gestational diabetes mellitus in pregnancy, unspecified control: Secondary | ICD-10-CM

## 2020-08-09 DIAGNOSIS — Z8759 Personal history of other complications of pregnancy, childbirth and the puerperium: Secondary | ICD-10-CM

## 2020-08-09 DIAGNOSIS — F439 Reaction to severe stress, unspecified: Secondary | ICD-10-CM

## 2020-08-09 DIAGNOSIS — Z3480 Encounter for supervision of other normal pregnancy, unspecified trimester: Secondary | ICD-10-CM

## 2020-08-09 NOTE — Progress Notes (Signed)
   PRENATAL VISIT NOTE  Subjective:  Kaitlin [name] is a 30 y.o. [phone] at [redacted]w[redacted]d being seen today for ongoing prenatal care.  She is currently monitored for the following issues for this high-risk pregnancy and has History of preterm delivery; LGSIL on Pap smear of cervix; Migraine; History of gestational diabetes; History of gestational hypertension; History of shoulder dystocia in prior pregnancy; History of postpartum hemorrhage; and Supervision of other normal pregnancy, antepartum on their problem list.  Patient reports no complaints.  Contractions: Not present. Vag. Bleeding: None.  Movement: Present. Denies leaking of fluid.   The following portions of the patient's history were reviewed and updated as appropriate: allergies, current medications, past family history, past medical history, past social history, past surgical history and problem list.   Objective:   Vitals:   08/09/20 0921  BP: (!) 131/91  Pulse: (!) 114  Weight: 193 lb (87.5 kg)    Fetal Status: Fetal Heart Rate (bpm): 148   Movement: Present     General:  Alert, oriented and cooperative. Patient is in no acute distress.  Skin: Skin is warm and dry. No rash noted.   Cardiovascular: Normal heart rate noted  Respiratory: Normal respiratory effort, no problems with respiration noted  Abdomen: Soft, gravid, appropriate for gestational age.  Pain/Pressure: Absent     Pelvic: Cervical exam deferred        Extremities: Normal range of motion.  Edema: None  Mental Status: Normal mood and affect. Normal behavior. Normal judgment and thought content.   Assessment and Plan:  Pregnancy: [id number] at [redacted]w[redacted]d 1. Supervision of other normal pregnancy, antepartum Patient is doing well without complaints Third trimester labs with glucola today  2. History of gestational hypertension Elevated BP today without symptoms Monitor BP closely Follow up ultrasound today  Preterm labor symptoms and general obstetric precautions  including but not limited to vaginal bleeding, contractions, leaking of fluid and fetal movement were reviewed in detail with the patient. Please refer to After Visit Summary for other counseling recommendations.   Return in about 2 weeks (around 08/23/2020) for in person, ROB, High risk.  Future Appointments  Date Time Provider Department Center  08/09/2020  9:45 AM Trystan Eads, Gigi Gin, MD [facility] None  08/10/2020  9:45 AM [facility] NURSE [facility]  08/10/2020 10:00 AM [facility] US[facility]    Catalina Antigua, MD

## 2020-08-09 NOTE — Addendum Note (Signed)
Addended by: Dalphine Handing on: 08/09/2020 09:47 AM   Modules accepted: Orders

## 2020-08-09 NOTE — Addendum Note (Signed)
Addended by: Kennon Portela on: 08/09/2020 11:32 AM   Modules accepted: Orders

## 2020-08-09 NOTE — Progress Notes (Signed)
ROB [redacted]w[redacted]d  GTT today. Tdap: Declined   CC: Vaginal itching declines exam states Rx is not working. Has also tried OTC yeast treatment and it gave little relief. Pt states she reduced sugar intake.

## 2020-08-10 ENCOUNTER — Other Ambulatory Visit: Payer: Self-pay | Admitting: *Deleted

## 2020-08-10 ENCOUNTER — Other Ambulatory Visit: Payer: Self-pay | Admitting: Obstetrics and Gynecology

## 2020-08-10 ENCOUNTER — Ambulatory Visit: Payer: Medicaid Other | Attending: Obstetrics

## 2020-08-10 ENCOUNTER — Encounter: Payer: Self-pay | Admitting: *Deleted

## 2020-08-10 ENCOUNTER — Telehealth: Payer: Self-pay

## 2020-08-10 ENCOUNTER — Encounter: Payer: Self-pay | Admitting: Obstetrics and Gynecology

## 2020-08-10 ENCOUNTER — Ambulatory Visit: Payer: Medicaid Other | Admitting: *Deleted

## 2020-08-10 ENCOUNTER — Other Ambulatory Visit: Payer: Self-pay

## 2020-08-10 DIAGNOSIS — Z3A31 31 weeks gestation of pregnancy: Secondary | ICD-10-CM | POA: Diagnosis not present

## 2020-08-10 DIAGNOSIS — O3506X0 Maternal care for (suspected) central nervous system malformation or damage in fetus, hydrocephaly, not applicable or unspecified: Secondary | ICD-10-CM

## 2020-08-10 DIAGNOSIS — O09213 Supervision of pregnancy with history of pre-term labor, third trimester: Secondary | ICD-10-CM | POA: Diagnosis not present

## 2020-08-10 DIAGNOSIS — O24419 Gestational diabetes mellitus in pregnancy, unspecified control: Secondary | ICD-10-CM | POA: Insufficient documentation

## 2020-08-10 DIAGNOSIS — O350XX0 Maternal care for (suspected) central nervous system malformation in fetus, not applicable or unspecified: Secondary | ICD-10-CM

## 2020-08-10 DIAGNOSIS — Z3480 Encounter for supervision of other normal pregnancy, unspecified trimester: Secondary | ICD-10-CM | POA: Insufficient documentation

## 2020-08-10 DIAGNOSIS — E669 Obesity, unspecified: Secondary | ICD-10-CM

## 2020-08-10 DIAGNOSIS — O09293 Supervision of pregnancy with other poor reproductive or obstetric history, third trimester: Secondary | ICD-10-CM

## 2020-08-10 DIAGNOSIS — Z362 Encounter for other antenatal screening follow-up: Secondary | ICD-10-CM

## 2020-08-10 DIAGNOSIS — O3509X0 Maternal care for (suspected) other central nervous system malformation or damage in fetus, not applicable or unspecified: Secondary | ICD-10-CM

## 2020-08-10 DIAGNOSIS — IMO0002 Reserved for concepts with insufficient information to code with codable children: Secondary | ICD-10-CM

## 2020-08-10 DIAGNOSIS — O99213 Obesity complicating pregnancy, third trimester: Secondary | ICD-10-CM | POA: Diagnosis not present

## 2020-08-10 DIAGNOSIS — O09893 Supervision of other high risk pregnancies, third trimester: Secondary | ICD-10-CM | POA: Diagnosis not present

## 2020-08-10 DIAGNOSIS — Z148 Genetic carrier of other disease: Secondary | ICD-10-CM | POA: Diagnosis not present

## 2020-08-10 DIAGNOSIS — D696 Thrombocytopenia, unspecified: Secondary | ICD-10-CM | POA: Diagnosis not present

## 2020-08-10 DIAGNOSIS — O99113 Other diseases of the blood and blood-forming organs and certain disorders involving the immune mechanism complicating pregnancy, third trimester: Secondary | ICD-10-CM

## 2020-08-10 LAB — GLUCOSE TOLERANCE, 2 HOURS W/ 1HR
Glucose, 1 hour: 218 mg/dL — ABNORMAL HIGH (ref 65–179)
Glucose, 2 hour: 178 mg/dL — ABNORMAL HIGH (ref 65–152)
Glucose, Fasting: 129 mg/dL — ABNORMAL HIGH (ref 65–91)

## 2020-08-10 LAB — RPR: RPR Ser Ql: NONREACTIVE

## 2020-08-10 LAB — CBC
Hematocrit: 31 % — ABNORMAL LOW (ref 34.0–46.6)
Hemoglobin: 9.4 g/dL — ABNORMAL LOW (ref 11.1–15.9)
MCH: 19.4 pg — ABNORMAL LOW (ref 26.6–33.0)
MCHC: 30.3 g/dL — ABNORMAL LOW (ref 31.5–35.7)
MCV: 64 fL — ABNORMAL LOW (ref 79–97)
Platelets: 169 10*3/uL (ref 150–450)
RBC: 4.85 x10E6/uL (ref 3.77–5.28)
RDW: 18.1 % — ABNORMAL HIGH (ref 11.7–15.4)
WBC: 6 10*3/uL (ref 3.4–10.8)

## 2020-08-10 LAB — HIV ANTIBODY (ROUTINE TESTING W REFLEX): HIV Screen 4th Generation wRfx: NONREACTIVE

## 2020-08-10 MED ORDER — FERUMOXYTOL INJECTION 510 MG/17 ML
510.0000 mg | INTRAVENOUS | Status: AC
Start: 2020-08-10 — End: 2020-08-24

## 2020-08-10 NOTE — Telephone Encounter (Signed)
I called to inform patient that she is gestational diabetic based on lab results and that a referral had been made to diabetes management. Patient was informed that she would receive a call to set up appointment where she would learn how to check blood sugars and how to follow a diet for the duration of her pregnancy. Once I explained this to the patient she said she will not be attending this appointment and she will not be checking her blood sugars during her pregnancy. I encouraged patient to attend appointment because there could be complications to the pregnancy if she did not. Pt was adamant that she was not going to the appointment and hung up the phone. Will try to call patient back later in the day to discuss anemia and need for iron infusion per Dr. Jolayne Panther. Per Dr. Jolayne Panther, the diagnosis will need to be discussed further with patient at next ob appointment.

## 2020-08-10 NOTE — Addendum Note (Signed)
Addended by: Catalina Antigua on: 08/10/2020 08:43 AM   Modules accepted: Orders

## 2020-08-23 ENCOUNTER — Institutional Professional Consult (permissible substitution): Payer: Medicaid Other | Admitting: Licensed Clinical Social Worker

## 2020-08-23 ENCOUNTER — Ambulatory Visit (INDEPENDENT_AMBULATORY_CARE_PROVIDER_SITE_OTHER): Payer: Medicaid Other | Admitting: Obstetrics & Gynecology

## 2020-08-23 ENCOUNTER — Other Ambulatory Visit: Payer: Self-pay

## 2020-08-23 VITALS — BP 127/83 | HR 109 | Wt 192.0 lb

## 2020-08-23 DIAGNOSIS — Z91199 Patient's noncompliance with other medical treatment and regimen due to unspecified reason: Secondary | ICD-10-CM

## 2020-08-23 DIAGNOSIS — Z9119 Patient's noncompliance with other medical treatment and regimen: Secondary | ICD-10-CM

## 2020-08-23 DIAGNOSIS — O09893 Supervision of other high risk pregnancies, third trimester: Secondary | ICD-10-CM

## 2020-08-23 DIAGNOSIS — O99013 Anemia complicating pregnancy, third trimester: Secondary | ICD-10-CM

## 2020-08-23 DIAGNOSIS — Z3A33 33 weeks gestation of pregnancy: Secondary | ICD-10-CM

## 2020-08-23 DIAGNOSIS — O24419 Gestational diabetes mellitus in pregnancy, unspecified control: Secondary | ICD-10-CM

## 2020-08-23 DIAGNOSIS — O0993 Supervision of high risk pregnancy, unspecified, third trimester: Secondary | ICD-10-CM

## 2020-08-23 NOTE — Patient Instructions (Signed)
Gestational Diabetes Mellitus, Self-Care Caring for yourself after a diagnosis of gestational diabetes mellitus means keeping your blood sugar under control. This can be done through nutrition, exercise, lifestyle changes, insulin and other medicines, and support from your health care team. Your health care provider will set individualized treatment goals for you. What are the risks? If left untreated, gestational diabetes can cause problems for mother and baby. For the mother Women who get gestational diabetes are more likely to:  Have labor induced and deliver early.  Have problems during labor and delivery, if the baby is larger than normal. This includes difficult labor and damage to the birth canal.  Have a cesarean delivery.  Have problems with blood pressure, including high blood pressure and preeclampsia.  Get it again if they become pregnant.  Develop type 2 diabetes in the future. For the baby Gestational diabetes that is not treated can cause the baby to have:  Low blood glucose (hypoglycemia).  Larger-than-normal body size (macrosomia).  Breathing problems. How to monitor blood glucose Check your blood glucose every day and as often as told by your health care provider. To do this: 1. Wash your hands with soap and water for at least 20 seconds. 2. Prick the side of your finger (not the tip) with the lancet. Use a different finger each time. 3. Gently rub the finger until a small drop of blood appears. 4. Follow instructions that come with your meter for inserting the test strip, applying blood to the strip, and getting the result. 5. Write down your result and any notes. Blood glucose goals are:  95 mg/dL (5.3 mmol/L) when fasting.  140 mg/dL (7.8 mmol/L) 1 hour after a meal.  120 mg/dL (6.7 mmol/L) 2 hours after a meal.   Follow these instructions at home: Medicines  Take over-the-counter and prescription medicines only as told by your health care  provider.  If your health care provider prescribed insulin or other diabetes medicines: ? Take them every day. ? Do not run out of insulin or other medicines. Plan ahead so you always have them available. Eating and drinking  Follow instructions from your health care provider about eating or drinking restrictions.  See a diet and nutrition expert (registered dietician) to help you create an eating plan that helps control your diabetes. The foods in this plan will include: ? Lean proteins. ? Complex carbohydrates. These are carbohydrates that contain fiber, have a lot of nutrients, and are digested slowly. They include dried beans, nuts, and whole grain breads, cereals, or pasta. ? Fresh fruits and vegetables. ? Low-fat dairy products. ? Healthy fats.  Eat healthy snacks between nutritious meals.  Drink enough fluid to keep your urine pale yellow.  Keep a record of the carbohydrates that you eat. To do this: ? Read food labels. ? Learn the standard serving sizes of foods.  Make a sick day plan with your health care provider before you get sick. Follow this plan whenever you cannot eat or drink as usual.   Activity  Do exercises as told by your health care provider.  Do 30 or more minutes of physical activity a day, or as much physical activity as your health care provider recommends. It may help to control blood glucose levels after a meal if you: ? Do 10 minutes of exercise after each meal. ? Start this exercise 30 minutes after the meal.  If you start a new exercise or activity, work with your health care provider to adjust your   insulin, other medicines, or food as needed. Lifestyle  Do not drink alcohol.  Do not use any products that contain nicotine or tobacco, such as cigarettes, e-cigarettes, and chewing tobacco. If you need help quitting, ask your health care provider.  Learn to manage stress. If you need help with this, ask your health care provider. Body care  Keep  your vaccines up to date.  Practice good oral hygiene. To do this: ? Clean your teeth and gums two times a day. ? Floss one or more times a day. ? Visit your dentist one or more times every 6 months.  Stay at a healthy weight while you are pregnant. Your expected weight gain depends on your BMI (body mass index) before pregnancy. General instructions  Talk with your health care provider about the risk for high blood pressure during pregnancy (preeclampsia and eclampsia).  Share your diabetes management plan with people in your workplace, school, and household.  Check your urine for ketones when sick and as told by your health care provider. Ketones are made by the liver when a lack of glucose forces the body to use fat for energy.  Carry a medical alert card or wear medical alert jewelry that says you have gestational diabetes.  Keep all follow-up visits. This is important. Get care after delivery  Have your blood glucose level checked with an oral glucose tolerance test (OGTT) 4-12 weeks after delivery.  Get screened for diabetes at least every 3 years, or as often as told by your health care provider. Where to find more information  American Diabetes Association (ADA): diabetes.org  Association of Diabetes Care & Education Specialists (ADCES): diabeteseducator.org  Centers for Disease Control and Prevention (CDC): cdc.gov  American Pregnancy Association: americanpregnancy.org  [place] Department of Agriculture MyPlate: myplate.gov Contact a health care provider if:  Your blood glucose is above your target for two tests in a row.  You have a fever.  You have been sick for 2 days or more and are not getting better.  You have either of these problems for more than 6 hours: ? Vomiting every time you eat or drink. ? Diarrhea. Get help right away if you:  Become confused or cannot think clearly.  Have trouble breathing.  Have moderate or high ketones in your  urine.  Feel your baby is not moving as usual.  Develop unusual discharge or bleeding from your vagina.  Start having early (premature) contractions. Contractions may feel like a tightening in your lower abdomen  Have a severe headache. These symptoms may represent a serious problem that is an emergency. Do not wait to see if the symptoms will go away. Get medical help right away. Call your local emergency services (911 in the [place] Do not drive yourself to the hospital. Summary  Check your blood glucose every day during your pregnancy. Do this as often as told by your health care provider.  Take insulin or other diabetes medicines every day, if your health care provider prescribed them.  Have your blood glucose level checked 4-12 weeks after delivery.  Keep all follow-up visits. This is important. This information is not intended to replace advice given to you by your health care provider. Make sure you discuss any questions you have with your health care provider. Document Revised: 09/01/2019 Document Reviewed: 09/01/2019 Elsevier Patient Education  2021 Elsevier Inc.  

## 2020-08-23 NOTE — Progress Notes (Signed)
PRENATAL VISIT NOTE  Subjective:  Kaitlin [name] is a 30 y.o. [phone] at [redacted]w[redacted]d being seen today for ongoing prenatal care.  She is currently monitored for the following issues for this high-risk pregnancy and has History of preterm delivery; LGSIL on Pap smear of cervix; Migraine; History of gestational diabetes; History of gestational hypertension; History of shoulder dystocia in prior pregnancy; History of postpartum hemorrhage; Supervision of high-risk pregnancy; and Gestational diabetes mellitus (GDM) affecting pregnancy, antepartum on their problem list.  Patient reports no complaints.  Contractions: Irritability. Vag. Bleeding: None.  Movement: Present. Denies leaking of fluid.   The following portions of the patient's history were reviewed and updated as appropriate: allergies, current medications, past family history, past medical history, past social history, past surgical history and problem list.   Objective:   Vitals:   08/23/20 1004  BP: 127/83  Pulse: (!) 109  Weight: 192 lb (87.1 kg)    Fetal Status: Fetal Heart Rate (bpm): 152   Movement: Present     General:  Alert, oriented and cooperative. Patient is in no acute distress.  Skin: Skin is warm and dry. No rash noted.   Cardiovascular: Normal heart rate noted  Respiratory: Normal respiratory effort, no problems with respiration noted  Abdomen: Soft, gravid, appropriate for gestational age.  Pain/Pressure: Absent     Pelvic: Cervical exam deferred        Extremities: Normal range of motion.  Edema: None  Mental Status: Normal mood and affect. Normal behavior. Normal judgment and thought content.   Assessment and Plan:  Pregnancy: [id number] at [redacted]w[redacted]d 1. Gestational diabetes mellitus (GDM) affecting pregnancy, antepartum 2. Noncompliant pregnant patient in third trimester Patient is very combative about this, does not believe diagnosis.  Reports she had GDM with all her pregnancies, and had 9 lb infants.  Stressed that  having larger babies could be harmful, she did have shoulder dystocia last delivery and this is dangerous.  Stressed that she check her BGs as recommended, as we can determine if she needs treatment.  She then said she checks BS occasionally at home with leftover strips from previous pregnancy, refused to tell me the values.  Reiterated risks of uncontrolled GDM, risk of LGA, shoulder dystocia causing complications for mother and infant, hypertensive disorders, intrauterine fetal demise and other maternal-fetal morbidities.  She was nonchalant. Emphasized need for weekly BPP and discussed earlier IOL, she said she will not undergo IOL any earlier than her due date.  Talked to Dr. Judeth Cornfield, he agreed with plan for weekly BPP and recommended IOL at 38 weeks.  Patient needs weekly follow up, she says she is unable to come in weekly, recommended virtual follow up.  Of note, patient feels all her babies are fine, does not understand why we are so worried.   - [place] MFM FETAL BPP WO NON STRESS; Future  3. Anemia of pregnancy, third trimester Hgb 2 weeks ago was 9.4, IV iron recommended. She declined, she reports taking oral iron therapy daily.  4. [redacted] weeks gestation of pregnancy 5. Supervision of high risk pregnancy in third trimester Preterm labor symptoms and general obstetric precautions including but not limited to vaginal bleeding, contractions, leaking of fluid and fetal movement were reviewed in detail with the patient. Please refer to After Visit Summary for other counseling recommendations.   Return in about 1 week (around 08/30/2020) for Virtual OB Visit with MD (Constant).  Future Appointments  Date Time Provider Department Center  08/30/2020  1:30 PM Constant,  Gigi Gin, MD [facility] None  09/13/2020  1:00 PM [facility]SE [facility]  09/13/2020  1:15 PM [facility] US2 [facility]    Jaynie Collins, MD

## 2020-08-27 ENCOUNTER — Ambulatory Visit: Payer: Medicaid Other | Admitting: *Deleted

## 2020-08-27 ENCOUNTER — Other Ambulatory Visit: Payer: Self-pay

## 2020-08-27 ENCOUNTER — Ambulatory Visit (INDEPENDENT_AMBULATORY_CARE_PROVIDER_SITE_OTHER): Payer: Medicaid Other

## 2020-08-27 VITALS — BP 134/86 | HR 99 | Wt 192.0 lb

## 2020-08-27 DIAGNOSIS — Z3A33 33 weeks gestation of pregnancy: Secondary | ICD-10-CM | POA: Diagnosis not present

## 2020-08-27 DIAGNOSIS — O24419 Gestational diabetes mellitus in pregnancy, unspecified control: Secondary | ICD-10-CM

## 2020-08-27 NOTE — Progress Notes (Signed)

## 2020-08-30 ENCOUNTER — Telehealth: Payer: Medicaid Other | Admitting: Obstetrics and Gynecology

## 2020-09-01 ENCOUNTER — Telehealth: Payer: Medicaid Other | Admitting: Obstetrics and Gynecology

## 2020-09-02 ENCOUNTER — Other Ambulatory Visit: Payer: Self-pay

## 2020-09-02 ENCOUNTER — Ambulatory Visit (INDEPENDENT_AMBULATORY_CARE_PROVIDER_SITE_OTHER): Payer: Medicaid Other

## 2020-09-02 ENCOUNTER — Ambulatory Visit: Payer: Medicaid Other | Admitting: *Deleted

## 2020-09-02 VITALS — BP 126/87 | HR 97 | Wt 196.8 lb

## 2020-09-02 DIAGNOSIS — Z3A34 34 weeks gestation of pregnancy: Secondary | ICD-10-CM | POA: Diagnosis not present

## 2020-09-02 DIAGNOSIS — O219 Vomiting of pregnancy, unspecified: Secondary | ICD-10-CM

## 2020-09-02 DIAGNOSIS — O24419 Gestational diabetes mellitus in pregnancy, unspecified control: Secondary | ICD-10-CM

## 2020-09-02 MED ORDER — PROMETHAZINE HCL 25 MG PO TABS: 25.0000 mg | ORAL_TABLET | Freq: Four times a day (QID) | ORAL | 0 refills | Status: DC | PRN

## 2020-09-02 NOTE — Progress Notes (Signed)

## 2020-09-03 ENCOUNTER — Telehealth: Payer: Self-pay | Admitting: Family Medicine

## 2020-09-03 ENCOUNTER — Encounter: Payer: Medicaid Other | Admitting: Obstetrics and Gynecology

## 2020-09-03 NOTE — Telephone Encounter (Signed)
Called and left patient a message to return my call regarding the Child Care for her children for her upcoming appointments.

## 2020-09-07 ENCOUNTER — Other Ambulatory Visit: Payer: Medicaid Other

## 2020-09-07 ENCOUNTER — Ambulatory Visit: Payer: Medicaid Other

## 2020-09-10 ENCOUNTER — Other Ambulatory Visit: Payer: Medicaid Other

## 2020-09-13 ENCOUNTER — Ambulatory Visit: Payer: Medicaid Other | Attending: Obstetrics and Gynecology

## 2020-09-13 ENCOUNTER — Ambulatory Visit: Payer: Medicaid Other | Admitting: *Deleted

## 2020-09-13 ENCOUNTER — Other Ambulatory Visit: Payer: Self-pay | Admitting: Obstetrics and Gynecology

## 2020-09-13 ENCOUNTER — Encounter: Payer: Self-pay | Admitting: *Deleted

## 2020-09-13 ENCOUNTER — Other Ambulatory Visit: Payer: Self-pay

## 2020-09-13 DIAGNOSIS — E669 Obesity, unspecified: Secondary | ICD-10-CM | POA: Diagnosis not present

## 2020-09-13 DIAGNOSIS — O99113 Other diseases of the blood and blood-forming organs and certain disorders involving the immune mechanism complicating pregnancy, third trimester: Secondary | ICD-10-CM

## 2020-09-13 DIAGNOSIS — Z3A36 36 weeks gestation of pregnancy: Secondary | ICD-10-CM | POA: Diagnosis not present

## 2020-09-13 DIAGNOSIS — O09893 Supervision of other high risk pregnancies, third trimester: Secondary | ICD-10-CM

## 2020-09-13 DIAGNOSIS — Z8632 Personal history of gestational diabetes: Secondary | ICD-10-CM

## 2020-09-13 DIAGNOSIS — IMO0002 Reserved for concepts with insufficient information to code with codable children: Secondary | ICD-10-CM

## 2020-09-13 DIAGNOSIS — O99213 Obesity complicating pregnancy, third trimester: Secondary | ICD-10-CM | POA: Diagnosis not present

## 2020-09-13 DIAGNOSIS — O24410 Gestational diabetes mellitus in pregnancy, diet controlled: Secondary | ICD-10-CM

## 2020-09-13 DIAGNOSIS — O24419 Gestational diabetes mellitus in pregnancy, unspecified control: Secondary | ICD-10-CM

## 2020-09-13 DIAGNOSIS — O3663X0 Maternal care for excessive fetal growth, third trimester, not applicable or unspecified: Secondary | ICD-10-CM

## 2020-09-13 DIAGNOSIS — O09213 Supervision of pregnancy with history of pre-term labor, third trimester: Secondary | ICD-10-CM | POA: Diagnosis not present

## 2020-09-13 DIAGNOSIS — D696 Thrombocytopenia, unspecified: Secondary | ICD-10-CM | POA: Diagnosis not present

## 2020-09-13 DIAGNOSIS — O09293 Supervision of pregnancy with other poor reproductive or obstetric history, third trimester: Secondary | ICD-10-CM

## 2020-09-13 DIAGNOSIS — Z148 Genetic carrier of other disease: Secondary | ICD-10-CM

## 2020-09-13 DIAGNOSIS — O359XX0 Maternal care for (suspected) fetal abnormality and damage, unspecified, not applicable or unspecified: Secondary | ICD-10-CM

## 2020-09-13 DIAGNOSIS — O350XX0 Maternal care for (suspected) central nervous system malformation in fetus, not applicable or unspecified: Secondary | ICD-10-CM

## 2020-09-13 DIAGNOSIS — Z8759 Personal history of other complications of pregnancy, childbirth and the puerperium: Secondary | ICD-10-CM

## 2020-09-13 DIAGNOSIS — O3506X0 Maternal care for (suspected) central nervous system malformation or damage in fetus, hydrocephaly, not applicable or unspecified: Secondary | ICD-10-CM

## 2020-09-14 ENCOUNTER — Telehealth: Payer: Self-pay

## 2020-09-14 NOTE — Telephone Encounter (Signed)
-----   Message from Tereso Newcomer, MD sent at 09/13/2020 10:58 PM EDT ----- Regarding: FW: IOL Please help in scheduling this patient for IOL at 37 weeks as per MFM recommendations. I am currently on vacation, please have one of the providers in office sign off on this and enter IOL orders for her. Thank you!   [facility] ----- Message ----- From: Noralee Space, MD Sent: 09/13/2020   4:15 PM EDT To: Tereso Newcomer, MD Subject: Kaitlin Basque,  Ms. Shuffield had fetal growth assessment today and the EFW is at >99th percentile. She does not believe she has diabetes.  I counseled her on the increased risk of perinatal mortality. I recommended delivery at 37 to 38 weeks. She agreed to be delivered at 37 weeks. She also has a history of shoulder dystocia.  She does not want IOL on Fridays/Saturdays. I don't see any follow-up Ob appt.  Thank you  Daleen Bo

## 2020-09-15 ENCOUNTER — Other Ambulatory Visit: Payer: Self-pay

## 2020-09-15 ENCOUNTER — Encounter: Payer: Self-pay | Admitting: Family Medicine

## 2020-09-15 ENCOUNTER — Other Ambulatory Visit: Payer: Self-pay | Admitting: Advanced Practice Midwife

## 2020-09-15 ENCOUNTER — Ambulatory Visit (INDEPENDENT_AMBULATORY_CARE_PROVIDER_SITE_OTHER): Payer: Medicaid Other | Admitting: Family Medicine

## 2020-09-15 VITALS — BP 140/82 | HR 126 | Wt 197.0 lb

## 2020-09-15 DIAGNOSIS — O0993 Supervision of high risk pregnancy, unspecified, third trimester: Secondary | ICD-10-CM

## 2020-09-15 DIAGNOSIS — Z8759 Personal history of other complications of pregnancy, childbirth and the puerperium: Secondary | ICD-10-CM

## 2020-09-15 DIAGNOSIS — O24419 Gestational diabetes mellitus in pregnancy, unspecified control: Secondary | ICD-10-CM

## 2020-09-15 DIAGNOSIS — Z8751 Personal history of pre-term labor: Secondary | ICD-10-CM

## 2020-09-15 DIAGNOSIS — R87612 Low grade squamous intraepithelial lesion on cytologic smear of cervix (LGSIL): Secondary | ICD-10-CM

## 2020-09-15 NOTE — Patient Instructions (Signed)
 Contraception Choices Contraception, also called birth control, refers to methods or devices that prevent pregnancy. Hormonal methods Contraceptive implant A contraceptive implant is a thin, plastic tube that contains a hormone that prevents pregnancy. It is different from an intrauterine device (IUD). It is inserted into the upper part of the arm by a health care provider. Implants can be effective for up to 3 years. Progestin-only injections Progestin-only injections are injections of progestin, a synthetic form of the hormone progesterone. They are given every 3 months by a health care provider. Birth control pills Birth control pills are pills that contain hormones that prevent pregnancy. They must be taken once a day, preferably at the same time each day. A prescription is needed to use this method of contraception. Birth control patch The birth control patch contains hormones that prevent pregnancy. It is placed on the skin and must be changed once a week for three weeks and removed on the fourth week. A prescription is needed to use this method of contraception. Vaginal ring A vaginal ring contains hormones that prevent pregnancy. It is placed in the vagina for three weeks and removed on the fourth week. After that, the process is repeated with a new ring. A prescription is needed to use this method of contraception. Emergency contraceptive Emergency contraceptives prevent pregnancy after unprotected sex. They come in pill form and can be taken up to 5 days after sex. They work best the sooner they are taken after having sex. Most emergency contraceptives are available without a prescription. This method should not be used as your only form of birth control.   Barrier methods Female condom A female condom is a thin sheath that is worn over the penis during sex. Condoms keep sperm from going inside a woman's body. They can be used with a sperm-killing substance (spermicide) to increase their  effectiveness. They should be thrown away after one use. Female condom A female condom is a soft, loose-fitting sheath that is put into the vagina before sex. The condom keeps sperm from going inside a woman's body. They should be thrown away after one use. Diaphragm A diaphragm is a soft, dome-shaped barrier. It is inserted into the vagina before sex, along with a spermicide. The diaphragm blocks sperm from entering the uterus, and the spermicide kills sperm. A diaphragm should be left in the vagina for 6-8 hours after sex and removed within 24 hours. A diaphragm is prescribed and fitted by a health care provider. A diaphragm should be replaced every 1-2 years, after giving birth, after gaining more than 15 lb (6.8 kg), and after pelvic surgery. Cervical cap A cervical cap is a round, soft latex or plastic cup that fits over the cervix. It is inserted into the vagina before sex, along with spermicide. It blocks sperm from entering the uterus. The cap should be left in place for 6-8 hours after sex and removed within 48 hours. A cervical cap must be prescribed and fitted by a health care provider. It should be replaced every 2 years. Sponge A sponge is a soft, circular piece of polyurethane foam with spermicide in it. The sponge helps block sperm from entering the uterus, and the spermicide kills sperm. To use it, you make it wet and then insert it into the vagina. It should be inserted before sex, left in for at least 6 hours after sex, and removed and thrown away within 30 hours. Spermicides Spermicides are chemicals that kill or block sperm from entering the   cervix and uterus. They can come as a cream, jelly, suppository, foam, or tablet. A spermicide should be inserted into the vagina with an applicator at least 10-15 minutes before sex to allow time for it to work. The process must be repeated every time you have sex. Spermicides do not require a prescription.   Intrauterine  contraception Intrauterine device (IUD) An IUD is a T-shaped device that is put in a woman's uterus. There are two types:  Hormone IUD.This type contains progestin, a synthetic form of the hormone progesterone. This type can stay in place for 3-5 years.  Copper IUD.This type is wrapped in copper wire. It can stay in place for 10 years. Permanent methods of contraception Female tubal ligation In this method, a woman's fallopian tubes are sealed, tied, or blocked during surgery to prevent eggs from traveling to the uterus. Hysteroscopic sterilization In this method, a small, flexible insert is placed into each fallopian tube. The inserts cause scar tissue to form in the fallopian tubes and block them, so sperm cannot reach an egg. The procedure takes about 3 months to be effective. Another form of birth control must be used during those 3 months. Female sterilization This is a procedure to tie off the tubes that carry sperm (vasectomy). After the procedure, the man can still ejaculate fluid (semen). Another form of birth control must be used for 3 months after the procedure. Natural planning methods Natural family planning In this method, a couple does not have sex on days when the woman could become pregnant. Calendar method In this method, the woman keeps track of the length of each menstrual cycle, identifies the days when pregnancy can happen, and does not have sex on those days. Ovulation method In this method, a couple avoids sex during ovulation. Symptothermal method This method involves not having sex during ovulation. The woman typically checks for ovulation by watching changes in her temperature and in the consistency of cervical mucus. Post-ovulation method In this method, a couple waits to have sex until after ovulation. Where to find more information  Centers for Disease Control and Prevention: www.cdc.gov Summary  Contraception, also called birth control, refers to methods or  devices that prevent pregnancy.  Hormonal methods of contraception include implants, injections, pills, patches, vaginal rings, and emergency contraceptives.  Barrier methods of contraception can include female condoms, female condoms, diaphragms, cervical caps, sponges, and spermicides.  There are two types of IUDs (intrauterine devices). An IUD can be put in a woman's uterus to prevent pregnancy for 3-5 years.  Permanent sterilization can be done through a procedure for males and females. Natural family planning methods involve nothaving sex on days when the woman could become pregnant. This information is not intended to replace advice given to you by your health care provider. Make sure you discuss any questions you have with your health care provider. Document Revised: 09/01/2019 Document Reviewed: 09/01/2019 Elsevier Patient Education  2021 Else[employer] Inc.   Breastfeeding  Choosing to breastfeed is one of the best decisions you can make for yourself and your baby. A change in hormones during pregnancy causes your breasts to make breast milk in your milk-producing glands. Hormones prevent breast milk from being released before your baby is born. They also prompt milk flow after birth. Once breastfeeding has begun, thoughts of your baby, as well as his or her sucking or crying, can stimulate the release of milk from your milk-producing glands. Benefits of breastfeeding Research shows that breastfeeding offers many health benefits   for infants and mothers. It also offers a cost-free and convenient way to feed your baby. For your baby  Your first milk (colostrum) helps your baby's digestive system to function better.  Special cells in your milk (antibodies) help your baby to fight off infections.  Breastfed babies are less likely to develop asthma, allergies, obesity, or type 2 diabetes. They are also at lower risk for sudden infant death syndrome (SIDS).  Nutrients in breast milk are better  able to meet your baby's needs compared to infant formula.  Breast milk improves your baby's brain development. For you  Breastfeeding helps to create a very special bond between you and your baby.  Breastfeeding is convenient. Breast milk costs nothing and is always available at the correct temperature.  Breastfeeding helps to burn calories. It helps you to lose the weight that you gained during pregnancy.  Breastfeeding makes your uterus return faster to its size before pregnancy. It also slows bleeding (lochia) after you give birth.  Breastfeeding helps to lower your risk of developing type 2 diabetes, osteoporosis, rheumatoid arthritis, cardiovascular disease, and breast, ovarian, uterine, and endometrial cancer later in life. Breastfeeding basics Starting breastfeeding  Find a comfortable place to sit or lie down, with your neck and back well-supported.  Place a pillow or a rolled-up blanket under your baby to bring him or her to the level of your breast (if you are seated). Nursing pillows are specially designed to help support your arms and your baby while you breastfeed.  Make sure that your baby's tummy (abdomen) is facing your abdomen.  Gently massage your breast. With your fingertips, massage from the outer edges of your breast inward toward the nipple. This encourages milk flow. If your milk flows slowly, you may need to continue this action during the feeding.  Support your breast with 4 fingers underneath and your thumb above your nipple (make the letter "C" with your hand). Make sure your fingers are well away from your nipple and your baby's mouth.  Stroke your baby's lips gently with your finger or nipple.  When your baby's mouth is open wide enough, quickly bring your baby to your breast, placing your entire nipple and as much of the areola as possible into your baby's mouth. The areola is the colored area around your nipple. ? More areola should be visible above your  baby's upper lip than below the lower lip. ? Your baby's lips should be opened and extended outward (flanged) to ensure an adequate, comfortable latch. ? Your baby's tongue should be between his or her lower gum and your breast.  Make sure that your baby's mouth is correctly positioned around your nipple (latched). Your baby's lips should create a seal on your breast and be turned out (everted).  It is common for your baby to suck about 2-3 minutes in order to start the flow of breast milk. Latching Teaching your baby how to latch onto your breast properly is very important. An improper latch can cause nipple pain, decreased milk supply, and poor weight gain in your baby. Also, if your baby is not latched onto your nipple properly, he or she may swallow some air during feeding. This can make your baby fussy. Burping your baby when you switch breasts during the feeding can help to get rid of the air. However, teaching your baby to latch on properly is still the best way to prevent fussiness from swallowing air while breastfeeding. Signs that your baby has successfully latched onto   your nipple  Silent tugging or silent sucking, without causing you pain. Infant's lips should be extended outward (flanged).  Swallowing heard between every 3-4 sucks once your milk has started to flow (after your let-down milk reflex occurs).  Muscle movement above and in front of his or her ears while sucking. Signs that your baby has not successfully latched onto your nipple  Sucking sounds or smacking sounds from your baby while breastfeeding.  Nipple pain. If you think your baby has not latched on correctly, slip your finger into the corner of your baby's mouth to break the suction and place it between your baby's gums. Attempt to start breastfeeding again. Signs of successful breastfeeding Signs from your baby  Your baby will gradually decrease the number of sucks or will completely stop sucking.  Your baby  will fall asleep.  Your baby's body will relax.  Your baby will retain a small amount of milk in his or her mouth.  Your baby will let go of your breast by himself or herself. Signs from you  Breasts that have increased in firmness, weight, and size 1-3 hours after feeding.  Breasts that are softer immediately after breastfeeding.  Increased milk volume, as well as a change in milk consistency and color by the fifth day of breastfeeding.  Nipples that are not sore, cracked, or bleeding. Signs that your baby is getting enough milk  Wetting at least 1-2 diapers during the first 24 hours after birth.  Wetting at least 5-6 diapers every 24 hours for the first week after birth. The urine should be clear or pale yellow by the age of 5 days.  Wetting 6-8 diapers every 24 hours as your baby continues to grow and develop.  At least 3 stools in a 24-hour period by the age of 5 days. The stool should be soft and yellow.  At least 3 stools in a 24-hour period by the age of 7 days. The stool should be seedy and yellow.  No loss of weight greater than 10% of birth weight during the first 3 days of life.  Average weight gain of 4-7 oz (113-198 g) per week after the age of 4 days.  Consistent daily weight gain by the age of 5 days, without weight loss after the age of 2 weeks. After a feeding, your baby may spit up a small amount of milk. This is normal. Breastfeeding frequency and duration Frequent feeding will help you make more milk and can prevent sore nipples and extremely full breasts (breast engorgement). Breastfeed when you feel the need to reduce the fullness of your breasts or when your baby shows signs of hunger. This is called "breastfeeding on demand." Signs that your baby is hungry include:  Increased alertness, activity, or restlessness.  Movement of the head from side to side.  Opening of the mouth when the corner of the mouth or cheek is stroked (rooting).  Increased  sucking sounds, smacking lips, cooing, sighing, or squeaking.  Hand-to-mouth movements and sucking on fingers or hands.  Fussing or crying. Avoid introducing a pacifier to your baby in the first 4-6 weeks after your baby is born. After this time, you may choose to use a pacifier. Research has shown that pacifier use during the first year of a baby's life decreases the risk of sudden infant death syndrome (SIDS). Allow your baby to feed on each breast as long as he or she wants. When your baby unlatches or falls asleep while feeding from the   first breast, offer the second breast. Because newborns are often sleepy in the first few weeks of life, you may need to awaken your baby to get him or her to feed. Breastfeeding times will vary from baby to baby. However, the following rules can serve as a guide to help you make sure that your baby is properly fed:  Newborns (babies 4 weeks of age or younger) may breastfeed every 1-3 hours.  Newborns should not go without breastfeeding for longer than 3 hours during the day or 5 hours during the night.  You should breastfeed your baby a minimum of 8 times in a 24-hour period. Breast milk pumping Pumping and storing breast milk allows you to make sure that your baby is exclusively fed your breast milk, even at times when you are unable to breastfeed. This is especially important if you go back to work while you are still breastfeeding, or if you are not able to be present during feedings. Your lactation consultant can help you find a method of pumping that works best for you and give you guidelines about how long it is safe to store breast milk.      Caring for your breasts while you breastfeed Nipples can become dry, cracked, and sore while breastfeeding. The following recommendations can help keep your breasts moisturized and healthy:  Avoid using soap on your nipples.  Wear a supportive bra designed especially for nursing. Avoid wearing underwire-style  bras or extremely tight bras (sports bras).  Air-dry your nipples for 3-4 minutes after each feeding.  Use only cotton bra pads to absorb leaked breast milk. Leaking of breast milk between feedings is normal.  Use lanolin on your nipples after breastfeeding. Lanolin helps to maintain your skin's normal moisture barrier. Pure lanolin is not harmful (not toxic) to your baby. You may also hand express a few drops of breast milk and gently massage that milk into your nipples and allow the milk to air-dry. In the first few weeks after giving birth, some women experience breast engorgement. Engorgement can make your breasts feel heavy, warm, and tender to the touch. Engorgement peaks within 3-5 days after you give birth. The following recommendations can help to ease engorgement:  Completely empty your breasts while breastfeeding or pumping. You may want to start by applying warm, moist heat (in the shower or with warm, water-soaked hand towels) just before feeding or pumping. This increases circulation and helps the milk flow. If your baby does not completely empty your breasts while breastfeeding, pump any extra milk after he or she is finished.  Apply ice packs to your breasts immediately after breastfeeding or pumping, unless this is too uncomfortable for you. To do this: ? Put ice in a plastic bag. ? Place a towel between your skin and the bag. ? Leave the ice on for 20 minutes, 2-3 times a day.  Make sure that your baby is latched on and positioned properly while breastfeeding. If engorgement persists after 48 hours of following these recommendations, contact your health care provider or a lactation consultant. Overall health care recommendations while breastfeeding  Eat 3 healthy meals and 3 snacks every day. Well-nourished mothers who are breastfeeding need an additional 450-500 calories a day. You can meet this requirement by increasing the amount of a balanced diet that you eat.  Drink  enough water to keep your urine pale yellow or clear.  Rest often, relax, and continue to take your prenatal vitamins to prevent fatigue, stress, and low   vitamin and mineral levels in your body (nutrient deficiencies).  Do not use any products that contain nicotine or tobacco, such as cigarettes and e-cigarettes. Your baby may be harmed by chemicals from cigarettes that pass into breast milk and exposure to secondhand smoke. If you need help quitting, ask your health care provider.  Avoid alcohol.  Do not use illegal drugs or marijuana.  Talk with your health care provider before taking any medicines. These include over-the-counter and prescription medicines as well as vitamins and herbal supplements. Some medicines that may be harmful to your baby can pass through breast milk.  It is possible to become pregnant while breastfeeding. If birth control is desired, ask your health care provider about options that will be safe while breastfeeding your baby. Where to find more information: [employer]: www.llli.org Contact a health care provider if:  You feel like you want to stop breastfeeding or have become frustrated with breastfeeding.  Your nipples are cracked or bleeding.  Your breasts are red, tender, or warm.  You have: ? Painful breasts or nipples. ? A swollen area on either breast. ? A fever or chills. ? Nausea or vomiting. ? Drainage other than breast milk from your nipples.  Your breasts do not become full before feedings by the fifth day after you give birth.  You feel sad and depressed.  Your baby is: ? Too sleepy to eat well. ? Having trouble sleeping. ? More than 1 week old and wetting fewer than 6 diapers in a 24-hour period. ? Not gaining weight by 5 days of age.  Your baby has fewer than 3 stools in a 24-hour period.  Your baby's skin or the [place] parts of his or her eyes become yellow. Get help right away if:  Your baby is overly tired  (lethargic) and does not want to wake up and feed.  Your baby develops an unexplained fever. Summary  Breastfeeding offers many health benefits for infant and mothers.  Try to breastfeed your infant when he or she shows early signs of hunger.  Gently tickle or stroke your baby's lips with your finger or nipple to allow the baby to open his or her mouth. Bring the baby to your breast. Make sure that much of the areola is in your baby's mouth. Offer one side and burp the baby before you offer the other side.  Talk with your health care provider or lactation consultant if you have questions or you face problems as you breastfeed. This information is not intended to replace advice given to you by your health care provider. Make sure you discuss any questions you have with your health care provider. Document Revised: 06/21/2017 Document Reviewed: 04/28/2016 Elsevier Patient Education  2021 Elsevier Inc.  

## 2020-09-15 NOTE — Progress Notes (Signed)
ROB [redacted]w[redacted]d  CC: None   Pt declines having GBS and GC/CT done today.

## 2020-09-15 NOTE — Progress Notes (Signed)
   Subjective:  Kaitlin [name] is a 30 y.o. [id number] at [redacted]w[redacted]d being seen today for ongoing prenatal care.  She is currently monitored for the following issues for this high-risk pregnancy and has History of preterm delivery; LGSIL on Pap smear of cervix; Migraine; History of gestational diabetes; History of gestational hypertension; History of shoulder dystocia in prior pregnancy; History of postpartum hemorrhage; Supervision of high-risk pregnancy; and Gestational diabetes mellitus (GDM) affecting pregnancy, antepartum on their problem list.  Patient reports no complaints.  Contractions: Irritability. Vag. Bleeding: None.  Movement: Present. Denies leaking of fluid.   The following portions of the patient's history were reviewed and updated as appropriate: allergies, current medications, past family history, past medical history, past social history, past surgical history and problem list. Problem list updated.  Objective:   Vitals:   09/15/20 1345  BP: 140/82  Pulse: (!) 126  Weight: 197 lb (89.4 kg)    Fetal Status: Fetal Heart Rate (bpm): 160   Movement: Present     General:  Alert, oriented and cooperative. Patient is in no acute distress.  Skin: Skin is warm and dry. No rash noted.   Cardiovascular: Normal heart rate noted  Respiratory: Normal respiratory effort, no problems with respiration noted  Abdomen: Soft, gravid, appropriate for gestational age. Pain/Pressure: Absent     Pelvic: Vag. Bleeding: None     Cervical exam deferred        Extremities: Normal range of motion.  Edema: None  Mental Status: Normal mood and affect. Normal behavior. Normal judgment and thought content.   Urinalysis:      Assessment and Plan:  Pregnancy: [id number] at [redacted]w[redacted]d  1. Supervision of high risk pregnancy in third trimester BP elevated. I recommended labs, discussed risks of possible gHTN/PreE, she adamanatly declines Also refused GBS swab. Explained rationale of testing as well as how it is  treated, she continued to decline. See MFM note from two days prior, agreed at that time to IOL at 37 weeks and is still OK with this. Orders placed and form will be faxed for IOL on 09/19/2020 at 0800 - Strep Gp B NAA - Cervicovaginal ancillary only( [place])  2. Gestational diabetes mellitus (GDM) affecting pregnancy, antepartum Not discussed. See excellent documentation from Dr. Judeth Cornfield in imaging tab and last visit with Dr. Macon Large. Disputes diagnosis and has not been checking sugars. Most recent growth [place] from two days prior shows macrosomic infant with EFW ~4100g, >99%.  3. LGSIL on Pap smear of cervix Needs colpo PP  4. History of preterm delivery At 36 weeks w first pregnancy  5. History of postpartum hemorrhage With last delivery, likely due to 3rd degree laceration and grand multiparity  6. History of gestational hypertension BP elevated today for second time, refused labs to evaluate for PreE  7. History of shoulder dystocia in prior pregnancy See delivery note from 10/30/2019 Four minute shoulder dystocia with humeral fracture and PPH, infant weight 4380g Discussed this with patient and offered PCS due to history of significant shoulder dystocia  She declines  Preterm labor symptoms and general obstetric precautions including but not limited to vaginal bleeding, contractions, leaking of fluid and fetal movement were reviewed in detail with the patient. Please refer to After Visit Summary for other counseling recommendations.  Return in 6 weeks (on 10/27/2020) for PP check.   Venora Maples, MD

## 2020-09-15 NOTE — Addendum Note (Signed)
Addended by: Marya Landry D on: 09/15/2020 02:35 PM   Modules accepted: Orders

## 2020-09-16 ENCOUNTER — Encounter (HOSPITAL_COMMUNITY): Payer: Self-pay | Admitting: *Deleted

## 2020-09-16 ENCOUNTER — Telehealth (HOSPITAL_COMMUNITY): Payer: Self-pay | Admitting: *Deleted

## 2020-09-16 NOTE — Telephone Encounter (Signed)
Preadmission screen  

## 2020-09-17 ENCOUNTER — Other Ambulatory Visit (HOSPITAL_COMMUNITY)
Admission: RE | Admit: 2020-09-17 | Discharge: 2020-09-17 | Disposition: A | Discharge status: Home / Self Care | Payer: Medicaid Other | Source: Ambulatory Visit | Attending: Obstetrics & Gynecology | Admitting: Obstetrics & Gynecology

## 2020-09-17 ENCOUNTER — Other Ambulatory Visit: Payer: Self-pay | Admitting: Advanced Practice Midwife

## 2020-09-17 DIAGNOSIS — Z01812 Encounter for preprocedural laboratory examination: Secondary | ICD-10-CM | POA: Insufficient documentation

## 2020-09-17 DIAGNOSIS — Z20822 Contact with and (suspected) exposure to covid-19: Secondary | ICD-10-CM | POA: Diagnosis not present

## 2020-09-17 LAB — SARS CORONAVIRUS 2 (TAT 6-24 HRS): SARS Coronavirus 2: NEGATIVE

## 2020-09-19 ENCOUNTER — Inpatient Hospital Stay (HOSPITAL_COMMUNITY)
Admission: AD | Admit: 2020-09-19 | Discharge: 2020-09-21 | DRG: 807 | Disposition: A | Discharge status: Home / Self Care | Payer: Medicaid Other | Attending: Obstetrics and Gynecology | Admitting: Obstetrics and Gynecology

## 2020-09-19 ENCOUNTER — Encounter (HOSPITAL_COMMUNITY): Payer: Self-pay | Admitting: Family Medicine

## 2020-09-19 ENCOUNTER — Inpatient Hospital Stay (HOSPITAL_COMMUNITY): Payer: Medicaid Other

## 2020-09-19 ENCOUNTER — Other Ambulatory Visit: Payer: Self-pay

## 2020-09-19 ENCOUNTER — Inpatient Hospital Stay (HOSPITAL_COMMUNITY): Payer: Medicaid Other | Admitting: Anesthesiology

## 2020-09-19 DIAGNOSIS — O133 Gestational [pregnancy-induced] hypertension without significant proteinuria, third trimester: Secondary | ICD-10-CM | POA: Diagnosis present

## 2020-09-19 DIAGNOSIS — O2412 Pre-existing diabetes mellitus, type 2, in childbirth: Secondary | ICD-10-CM | POA: Diagnosis not present

## 2020-09-19 DIAGNOSIS — Z880 Allergy status to penicillin: Secondary | ICD-10-CM

## 2020-09-19 DIAGNOSIS — O164 Unspecified maternal hypertension, complicating childbirth: Secondary | ICD-10-CM | POA: Diagnosis not present

## 2020-09-19 DIAGNOSIS — O24429 Gestational diabetes mellitus in childbirth, unspecified control: Secondary | ICD-10-CM | POA: Diagnosis present

## 2020-09-19 DIAGNOSIS — O99824 Streptococcus B carrier state complicating childbirth: Secondary | ICD-10-CM | POA: Diagnosis not present

## 2020-09-19 DIAGNOSIS — O134 Gestational [pregnancy-induced] hypertension without significant proteinuria, complicating childbirth: Secondary | ICD-10-CM | POA: Diagnosis not present

## 2020-09-19 DIAGNOSIS — O3663X0 Maternal care for excessive fetal growth, third trimester, not applicable or unspecified: Secondary | ICD-10-CM | POA: Diagnosis present

## 2020-09-19 DIAGNOSIS — O24419 Gestational diabetes mellitus in pregnancy, unspecified control: Secondary | ICD-10-CM

## 2020-09-19 DIAGNOSIS — O9902 Anemia complicating childbirth: Secondary | ICD-10-CM | POA: Diagnosis not present

## 2020-09-19 DIAGNOSIS — Z3A37 37 weeks gestation of pregnancy: Secondary | ICD-10-CM | POA: Diagnosis not present

## 2020-09-19 DIAGNOSIS — D563 Thalassemia minor: Secondary | ICD-10-CM | POA: Diagnosis not present

## 2020-09-19 DIAGNOSIS — O99019 Anemia complicating pregnancy, unspecified trimester: Secondary | ICD-10-CM | POA: Diagnosis present

## 2020-09-19 LAB — PROTEIN / CREATININE RATIO, URINE
Creatinine, Urine: 187.69 mg/dL
Protein Creatinine Ratio: 0.14 mg/mg{Cre} (ref 0.00–0.15)
Total Protein, Urine: 27 mg/dL

## 2020-09-19 LAB — CBC
HCT: 34 % — ABNORMAL LOW (ref 36.0–46.0)
HCT: 29.2 % — ABNORMAL LOW (ref 36.0–46.0)
Hemoglobin: 10.3 g/dL — ABNORMAL LOW (ref 12.0–15.0)
Hemoglobin: 8.8 g/dL — ABNORMAL LOW (ref 12.0–15.0)
MCH: 19.3 pg — ABNORMAL LOW (ref 26.0–34.0)
MCH: 19.3 pg — ABNORMAL LOW (ref 26.0–34.0)
MCHC: 30.3 g/dL (ref 30.0–36.0)
MCHC: 30.1 g/dL (ref 30.0–36.0)
MCV: 63.8 fL — ABNORMAL LOW (ref 80.0–100.0)
MCV: 64.2 fL — ABNORMAL LOW (ref 80.0–100.0)
Platelets: 153 10*3/uL (ref 150–400)
Platelets: 148 10*3/uL — ABNORMAL LOW (ref 150–400)
RBC: 5.33 MIL/uL — ABNORMAL HIGH (ref 3.87–5.11)
RBC: 4.55 MIL/uL (ref 3.87–5.11)
RDW: 22.6 % — ABNORMAL HIGH (ref 11.5–15.5)
RDW: 22.3 % — ABNORMAL HIGH (ref 11.5–15.5)
WBC: 5.8 10*3/uL (ref 4.0–10.5)
WBC: 10 10*3/uL (ref 4.0–10.5)
nRBC: 0 % (ref 0.0–0.2)
nRBC: 0 % (ref 0.0–0.2)

## 2020-09-19 LAB — TYPE AND SCREEN
ABO/RH(D): A POS
Antibody Screen: NEGATIVE

## 2020-09-19 LAB — COMPREHENSIVE METABOLIC PANEL
ALT: 8 U/L (ref 0–44)
AST: 15 U/L (ref 15–41)
Albumin: 2.8 g/dL — ABNORMAL LOW (ref 3.5–5.0)
Alkaline Phosphatase: 136 U/L — ABNORMAL HIGH (ref 38–126)
Anion gap: 9 (ref 5–15)
BUN: <5 mg/dL — ABNORMAL LOW (ref 6–20)
CO2: 21 mmol/L — ABNORMAL LOW (ref 22–32)
Calcium: 9 mg/dL (ref 8.9–10.3)
Chloride: 107 mmol/L (ref 98–111)
Creatinine, Ser: 0.44 mg/dL (ref 0.44–1.00)
GFR, Estimated: >60 mL/min (ref >60)
Glucose, Bld: 117 mg/dL — ABNORMAL HIGH (ref 70–99)
Potassium: 3.5 mmol/L (ref 3.5–5.1)
Sodium: 137 mmol/L (ref 135–145)
Total Bilirubin: 0.6 mg/dL (ref 0.3–1.2)
Total Protein: 7 g/dL (ref 6.5–8.1)

## 2020-09-19 LAB — RPR: RPR Ser Ql: NONREACTIVE

## 2020-09-19 LAB — GROUP B STREP BY PCR: Group B strep by PCR: POSITIVE — AB

## 2020-09-19 LAB — GLUCOSE, CAPILLARY
Glucose-Capillary: 82 mg/dL (ref 70–99)
Glucose-Capillary: 68 mg/dL — ABNORMAL LOW (ref 70–99)
Glucose-Capillary: 66 mg/dL — ABNORMAL LOW (ref 70–99)

## 2020-09-19 MED ORDER — MAGNESIUM HYDROXIDE 400 MG/5ML PO SUSP: 30.0000 mL | ORAL | Status: DC | PRN

## 2020-09-19 MED ORDER — ONDANSETRON HCL 4 MG PO TABS: 4.0000 mg | ORAL_TABLET | ORAL | Status: DC | PRN

## 2020-09-19 MED ORDER — TERBUTALINE SULFATE 1 MG/ML IJ SOLN: 0.2500 mg | Freq: Once | INTRAMUSCULAR | Status: DC | PRN

## 2020-09-19 MED ORDER — OXYTOCIN-SODIUM CHLORIDE 30-0.9 UT/500ML-% IV SOLN
1.0000 m[IU]/min | INTRAVENOUS | Status: DC
  Administered 2020-09-19: 2 m[IU]/min via INTRAVENOUS

## 2020-09-19 MED ORDER — OXYCODONE HCL 5 MG PO TABS: 10.0000 mg | ORAL_TABLET | ORAL | Status: DC | PRN

## 2020-09-19 MED ORDER — LACTATED RINGERS IV SOLN: 500.0000 mL | Freq: Once | INTRAVENOUS | Status: DC

## 2020-09-19 MED ORDER — WITCH HAZEL-GLYCERIN EX PADS: 1.0000 "application " | MEDICATED_PAD | CUTANEOUS | Status: DC | PRN

## 2020-09-19 MED ORDER — MISOPROSTOL 200 MCG PO TABS
ORAL_TABLET | ORAL | Status: AC
  Administered 2020-09-19: 1000 ug
  Filled 2020-09-19: qty 5

## 2020-09-19 MED ORDER — EPHEDRINE 5 MG/ML INJ: 10.0000 mg | INTRAVENOUS | Status: DC | PRN

## 2020-09-19 MED ORDER — TETANUS-DIPHTH-ACELL PERTUSSIS 5-2.5-18.5 LF-MCG/0.5 IM SUSY: 0.5000 mL | PREFILLED_SYRINGE | Freq: Once | INTRAMUSCULAR | Status: DC

## 2020-09-19 MED ORDER — OXYCODONE HCL 5 MG PO TABS: 5.0000 mg | ORAL_TABLET | ORAL | Status: DC | PRN

## 2020-09-19 MED ORDER — LIDOCAINE HCL (PF) 1 % IJ SOLN: 30.0000 mL | INTRAMUSCULAR | Status: DC | PRN

## 2020-09-19 MED ORDER — FERROUS SULFATE 325 (65 FE) MG PO TABS
325.0000 mg | ORAL_TABLET | ORAL | Status: DC
  Administered 2020-09-20: 325 mg via ORAL
  Filled 2020-09-19: qty 1

## 2020-09-19 MED ORDER — PHENYLEPHRINE 40 MCG/ML (10ML) SYRINGE FOR IV PUSH (FOR BLOOD PRESSURE SUPPORT)
80.0000 ug | PREFILLED_SYRINGE | INTRAVENOUS | Status: DC | PRN
  Filled 2020-09-19: qty 10

## 2020-09-19 MED ORDER — LACTATED RINGERS IV SOLN
500.0000 mL | INTRAVENOUS | Status: DC | PRN
  Administered 2020-09-19: 500 mL via INTRAVENOUS

## 2020-09-19 MED ORDER — METHYLERGONOVINE MALEATE 0.2 MG/ML IJ SOLN
0.2000 mg | Freq: Once | INTRAMUSCULAR | Status: AC
  Administered 2020-09-19: 0.2 mg via INTRAMUSCULAR

## 2020-09-19 MED ORDER — PHENYLEPHRINE 40 MCG/ML (10ML) SYRINGE FOR IV PUSH (FOR BLOOD PRESSURE SUPPORT): 80.0000 ug | PREFILLED_SYRINGE | INTRAVENOUS | Status: DC | PRN

## 2020-09-19 MED ORDER — COCONUT OIL OIL: 1.0000 "application " | TOPICAL_OIL | Status: DC | PRN

## 2020-09-19 MED ORDER — ACETAMINOPHEN 325 MG PO TABS: 650.0000 mg | ORAL_TABLET | ORAL | Status: DC | PRN

## 2020-09-19 MED ORDER — DIBUCAINE (PERIANAL) 1 % EX OINT: 1.0000 "application " | TOPICAL_OINTMENT | CUTANEOUS | Status: DC | PRN

## 2020-09-19 MED ORDER — SIMETHICONE 80 MG PO CHEW: 80.0000 mg | CHEWABLE_TABLET | ORAL | Status: DC | PRN

## 2020-09-19 MED ORDER — MEASLES, MUMPS & RUBELLA VAC IJ SOLR
0.5000 mL | Freq: Once | INTRAMUSCULAR | Status: DC
Start: 2020-09-20 — End: 2020-09-21

## 2020-09-19 MED ORDER — BENZOCAINE-MENTHOL 20-0.5 % EX AERO: 1.0000 "application " | INHALATION_SPRAY | CUTANEOUS | Status: DC | PRN

## 2020-09-19 MED ORDER — OXYTOCIN BOLUS FROM INFUSION
333.0000 mL | Freq: Once | INTRAVENOUS | Status: AC
  Administered 2020-09-19: 333 mL via INTRAVENOUS

## 2020-09-19 MED ORDER — IBUPROFEN 600 MG PO TABS
600.0000 mg | ORAL_TABLET | Freq: Four times a day (QID) | ORAL | Status: DC
  Administered 2020-09-19 – 2020-09-21 (×6): 600 mg via ORAL
  Filled 2020-09-19 (×6): qty 1

## 2020-09-19 MED ORDER — OXYCODONE-ACETAMINOPHEN 5-325 MG PO TABS
2.0000 | ORAL_TABLET | ORAL | Status: DC | PRN
Start: 2020-09-19 — End: 2020-09-19

## 2020-09-19 MED ORDER — LACTATED RINGERS IV SOLN: INTRAVENOUS | Status: DC

## 2020-09-19 MED ORDER — ONDANSETRON HCL 4 MG/2ML IJ SOLN: 4.0000 mg | Freq: Four times a day (QID) | INTRAMUSCULAR | Status: DC | PRN

## 2020-09-19 MED ORDER — LIDOCAINE HCL (PF) 1 % IJ SOLN
INTRAMUSCULAR | Status: DC | PRN
  Administered 2020-09-19 (×2): 5 mL via EPIDURAL

## 2020-09-19 MED ORDER — DIPHENHYDRAMINE HCL 25 MG PO CAPS: 25.0000 mg | ORAL_CAPSULE | Freq: Four times a day (QID) | ORAL | Status: DC | PRN

## 2020-09-19 MED ORDER — ONDANSETRON HCL 4 MG/2ML IJ SOLN: 4.0000 mg | INTRAMUSCULAR | Status: DC | PRN

## 2020-09-19 MED ORDER — DIPHENHYDRAMINE HCL 50 MG/ML IJ SOLN: 12.5000 mg | INTRAMUSCULAR | Status: DC | PRN

## 2020-09-19 MED ORDER — PRENATAL MULTIVITAMIN CH
1.0000 | ORAL_TABLET | Freq: Every day | ORAL | Status: DC
  Administered 2020-09-20: 1 via ORAL
  Filled 2020-09-19: qty 1

## 2020-09-19 MED ORDER — OXYTOCIN-SODIUM CHLORIDE 30-0.9 UT/500ML-% IV SOLN
2.5000 [IU]/h | INTRAVENOUS | Status: DC
  Administered 2020-09-19: 2.5 [IU]/h via INTRAVENOUS
  Filled 2020-09-19: qty 500

## 2020-09-19 MED ORDER — TRANEXAMIC ACID-NACL 1000-0.7 MG/100ML-% IV SOLN
INTRAVENOUS | Status: AC
  Administered 2020-09-19: 1000 mg
  Filled 2020-09-19: qty 100

## 2020-09-19 MED ORDER — FENTANYL-BUPIVACAINE-NACL 0.5-0.125-0.9 MG/250ML-% EP SOLN
12.0000 mL/h | EPIDURAL | Status: DC | PRN
  Administered 2020-09-19: 12 mL/h via EPIDURAL
  Filled 2020-09-19: qty 250

## 2020-09-19 MED ORDER — SOD CITRATE-CITRIC ACID 500-334 MG/5ML PO SOLN: 30.0000 mL | ORAL | Status: DC | PRN

## 2020-09-19 MED ORDER — OXYCODONE-ACETAMINOPHEN 5-325 MG PO TABS
1.0000 | ORAL_TABLET | ORAL | Status: DC | PRN
Start: 2020-09-19 — End: 2020-09-19

## 2020-09-19 MED ORDER — METHYLERGONOVINE MALEATE 0.2 MG/ML IJ SOLN
INTRAMUSCULAR | Status: AC
  Filled 2020-09-19: qty 1

## 2020-09-19 NOTE — Progress Notes (Signed)
RN notified CNM of additional 232 ml bleeding and continued trickle. FF U/2. Ordered Methergine x 1 and cath.   Katrinka Blazing, [place], [place] 09/19/2020 7:59 PM

## 2020-09-19 NOTE — Anesthesia Procedure Notes (Signed)
Epidural Patient location during procedure: OB Start time: 09/19/2020 12:35 PM End time: 09/19/2020 12:50 PM  Staffing Anesthesiologist: Atilano Median, DO Performed: anesthesiologist   Preanesthetic Checklist Completed: patient identified, IV checked, site marked, risks and benefits discussed, surgical consent, monitors and equipment checked, pre-op evaluation and timeout performed  Epidural Patient position: sitting Prep: ChloraPrep Patient monitoring: heart rate, continuous pulse ox and blood pressure Approach: midline Location: L3-L4 Injection technique: LOR saline  Needle:  Needle type: Tuohy  Needle gauge: 17 G Needle length: 9 cm Needle insertion depth: 6 cm Catheter type: closed end flexible Catheter size: 20 Guage Catheter at skin depth: 11 cm Test dose: negative and 1.5% lidocaine  Assessment Sensory level: T6 Events: blood not aspirated, injection not painful, no injection resistance and no paresthesia  Additional Notes  Patient identified. Risks/Benefits/Options discussed with patient including but not limited to bleeding, infection, nerve damage, paralysis, failed block, incomplete pain control, headache, blood pressure changes, nausea, vomiting, reactions to medications, itching and postpartum back pain. Confirmed with bedside nurse the patient's most recent platelet count. Confirmed with patient that they are not currently taking any anticoagulation, have any bleeding history or any family history of bleeding disorders. Patient expressed understanding and wished to proceed. All questions were answered. Sterile technique was used throughout the entire procedure. Please see nursing notes for vital signs. Test dose was given through epidural catheter and negative prior to continuing to dose epidural or start infusion. Warning signs of high block given to the patient including shortness of breath, tingling/numbness in hands, complete motor block, or any concerning  symptoms with instructions to call for help. Patient was given instructions on fall risk and not to get out of bed. All questions and concerns addressed with instructions to call with any issues or inadequate analgesia.    Reason for block:procedure for pain

## 2020-09-19 NOTE — Plan of Care (Signed)
  Problem: Education: Goal: Knowledge of General Education information will improve Description: Including pain rating scale, medication(s)/side effects and non-pharmacologic comfort measures Outcome: Progressing   Problem: Health Behavior/Discharge Planning: Goal: Ability to manage health-related needs will improve Outcome: Progressing   Problem: Clinical Measurements: Goal: Ability to maintain clinical measurements within normal limits will improve Outcome: Progressing Goal: Will remain free from infection Outcome: Progressing Goal: Diagnostic test results will improve Outcome: Progressing Goal: Respiratory complications will improve Outcome: Progressing Goal: Cardiovascular complication will be avoided Outcome: Progressing   Problem: Activity: Goal: Risk for activity intolerance will decrease Outcome: Progressing   Problem: Nutrition: Goal: Adequate nutrition will be maintained Outcome: Progressing   Problem: Coping: Goal: Level of anxiety will decrease Outcome: Progressing   Problem: Elimination: Goal: Will not experience complications related to bowel motility Outcome: Progressing Goal: Will not experience complications related to urinary retention Outcome: Progressing   Problem: Pain Managment: Goal: General experience of comfort will improve Outcome: Progressing   Problem: Safety: Goal: Ability to remain free from injury will improve Outcome: Progressing   Problem: Skin Integrity: Goal: Risk for impaired skin integrity will decrease Outcome: Progressing   Problem: Education: Goal: Knowledge of disease or condition will improve Outcome: Progressing Goal: Knowledge of the prescribed therapeutic regimen will improve Outcome: Progressing   Problem: Fluid Volume: Goal: Peripheral tissue perfusion will improve Outcome: Progressing   Problem: Clinical Measurements: Goal: Complications related to disease process, condition or treatment will be avoided or  minimized Outcome: Progressing   

## 2020-09-19 NOTE — Progress Notes (Signed)
Kaitlin [name] is a 30 y.o. [id number] at [redacted]w[redacted]d.  Subjective: Comfortable w/ epidural.   Objective: BP 120/71   Pulse 92   Temp 97.8 F (36.6 C) (Oral)   Resp 16   Ht 5\' 4"  (1.626 m)   Wt 88.1 kg   LMP 01/04/2020   SpO2 99%   BMI 33.35 kg/m   Patient Vitals for the past 24 hrs:  BP Temp Temp src Pulse Resp SpO2 Height Weight  09/19/20 1431 125/70 -- -- 90 -- -- -- --  09/19/20 1411 120/71 -- -- 92 -- -- -- --  09/19/20 1401 124/89 -- -- 93 -- -- -- --  09/19/20 1321 (!) 142/87 -- -- 96 -- 99 % -- --  09/19/20 1316 131/87 -- -- 88 -- 98 % -- --  09/19/20 1311 123/83 -- -- 90 -- 98 % -- --  09/19/20 1306 125/82 -- -- 90 -- 98 % -- --  09/19/20 1301 131/87 -- -- 91 -- 98 % -- --  09/19/20 1256 122/86 -- -- 90 -- -- -- --  09/19/20 1251 136/78 -- -- 100 -- -- -- --  09/19/20 1232 113/81 -- -- (!) 102 -- -- -- --  09/19/20 1202 (!) 133/97 -- -- (!) 102 -- -- -- --  09/19/20 1201 (!) 133/97 -- -- (!) 102 -- -- -- --  09/19/20 1132 136/90 -- -- 99 -- -- -- --  09/19/20 1101 121/83 -- -- 95 -- -- -- --  09/19/20 1030 (!) 129/95 -- -- (!) 102 -- -- -- --  09/19/20 1005 (!) 141/96 -- -- 92 -- -- -- --  09/19/20 0937 137/89 -- -- (!) 102 16 -- -- --  09/19/20 0751 134/87 -- -- (!) 109 -- -- -- --  09/19/20 0750 134/87 97.8 F (36.6 C) Oral (!) 109 -- -- 5\' 4"  (1.626 m) 88.1 kg     FHT:  FHR: 150 bpm, variability: min-mod,  accelerations:  15x15,  decelerations:  none UC:   Q 3-5 minutes, mod Dilation: 6 Effacement (%): 60 Cervical Position: Anterior Station: -2 Presentation: Vertex Exam by:: V Pretty Weltman CNM AROM large amount of clear fluid.   Labs: Results for orders placed or performed during the hospital encounter of 09/19/20 (from the past 24 hour(s))  CBC     Status: Abnormal   Collection Time: 09/19/20  8:28 AM  Result Value Ref Range   WBC 5.8 4.0 - 10.5 K/uL   RBC 5.33 (H) 3.87 - 5.11 MIL/uL   Hemoglobin 10.3 (L) 12.0 - 15.0 g/dL   HCT 11/19/20 (L) 11/19/20 - 10.0 %   MCV  63.8 (L) 80.0 - 100.0 fL   MCH 19.3 (L) 26.0 - 34.0 pg   MCHC 30.3 30.0 - 36.0 g/dL   RDW 71.2 (H) 19.7 - 58.8 %   Platelets 153 150 - 400 K/uL   nRBC 0.0 0.0 - 0.2 %  Type and screen     Status: None   Collection Time: 09/19/20  8:28 AM  Result Value Ref Range   ABO/RH(D) A POS    Antibody Screen NEG    Sample Expiration      09/22/2020,2359 Performed at [facility], [address], [place], [address] [place]   RPR     Status: None   Collection Time: 09/19/20  8:28 AM  Result Value Ref Range   RPR Ser Ql NON REACTIVE NON REACTIVE  Protein / creatinine ratio, urine     Status:  None   Collection Time: 09/19/20  8:28 AM  Result Value Ref Range   Creatinine, Urine 187.69 mg/dL   Total Protein, Urine 27 mg/dL   Protein Creatinine Ratio 0.14 0.00 - 0.15 mg/mg[Cre]  Comprehensive metabolic panel     Status: Abnormal   Collection Time: 09/19/20  8:28 AM  Result Value Ref Range   Sodium 137 135 - 145 mmol/L   Potassium 3.5 3.5 - 5.1 mmol/L   Chloride 107 98 - 111 mmol/L   CO2 21 (L) 22 - 32 mmol/L   Glucose, Bld 117 (H) 70 - 99 mg/dL   BUN <5 (L) 6 - 20 mg/dL   Creatinine, Ser 7.89 0.44 - 1.00 mg/dL   Calcium 9.0 8.9 - 78.4 mg/dL   Total Protein 7.0 6.5 - 8.1 g/dL   Albumin 2.8 (L) 3.5 - 5.0 g/dL   AST 15 15 - 41 U/L   ALT 8 0 - 44 U/L   Alkaline Phosphatase 136 (H) 38 - 126 U/L   Total Bilirubin 0.6 0.3 - 1.2 mg/dL   GFR, Estimated >78 >41 mL/min   Anion gap 9 5 - 15    Assessment / Plan: [redacted]w[redacted]d week IUP Labor: active. Continue pitocin.  Fetal Wellbeing:  Category I Pain Control:  Epidural Anticipated MOD:  SVD w/ high risk for shoulder dystocia. GHTN: No severe-range BPs. No evidence of Pre-E  Katrinka Blazing [place], [place] 09/19/2020 2:33 PM

## 2020-09-19 NOTE — H&P (Addendum)
HPI: Kaitlin [name] is a 30 y.o. year old [id number] female at [redacted]w[redacted]d weeks gestation by LMP/21 wk [place] who presents to L&D for IOL for uncontrolled GDM vs Type 2 DM and GHTN. Does not believe she has Diabetes. Is likely Type 2 base on early Hbg A1C of 7.   Pregnancy complicated by: - Suspected fetal macrosomia. Est. FW: 4109  gm (9 lb 1 oz)   > 99  % on 09/13/20. Hx 4 minute shoulder dystocia w/ broken humerus 10/2019 w/ baby weighing 4380 gm. Per pt baby had no lasting injuries/impairments . Multiple providers recommended primary C/S this pregnancy for Hx severe SD and current EFW about the same as last baby. Declined C/S but agreed to IOL at 37 weeks.   - Uncontrolled DM.   - Declined GBS cultures. No known Hx GBS or GBS Dz in previous pregnancies/babies.   - GHTN.   - Hx PPH 1809 with 2021 delivery.   -  Unilateral left ventriculomegaly that appears to have resolved.     Patient Active Problem List   Diagnosis Date Noted   Gestational diabetes mellitus (GDM) 09/19/2020   Gestational hypertension, third trimester 09/19/2020   Gestational diabetes mellitus (GDM) affecting pregnancy, antepartum 08/10/2020   Supervision of high-risk pregnancy 04/12/2020   History of shoulder dystocia in prior pregnancy 10/30/2019   History of postpartum hemorrhage 10/30/2019   History of gestational hypertension 10/24/2019   History of gestational diabetes 09/03/2019   Migraine 07/16/2019   LGSIL on Pap smear of cervix 05/21/2019   History of preterm delivery 04/17/2019       Nursing Staff Provider  Office Location  [facility] Dating  LMP  Language  English Anatomy US  05-31-20  Flu Vaccine  Declined 04/21/20 Genetic Screen  NIPS:   Low risk female   TDaP Vaccine   Declined 08/09/20 Hgb A1C or  GTT Early A1C 7.0 Third trimester failed 2 hour  COVID Vaccine Not vaccinated   LAB RESULTS   Rhogam  NA Blood Type A/Positive/-- (01/12 1126)   Feeding Plan Breast/Bottle Antibody Negative (01/12 1126)   Contraception Declined Rubella 4.63 (01/12 1126)  Circumcision Yes if a boy RPR Non Reactive (01/12 1126)   Pediatrician  [facility] HBsAg Negative (01/12 1126)   Support Person FOB HCVAb Neg  Prenatal Classes  HIV Non Reactive (01/12 1126)     BTL Consent  GBS Negative/-- (07/16 1106) (For PCN allergy, check sensitivities)   VBAC Consent NA Pap 2021 LISL    Hgb Electro  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 (declined counseling)  BP Cuff Ordered 04/21/20 CF Neg    SMA Low risk    Waterbirth  [ ]  Class [ ]  Consent [ ]  CNM visit    Induction  [ ]  Orders Entered [ ] Foley Y/N   OB History     Gravida  6   Para  5   Term  4   Preterm  1   AB      Living  5      SAB      IAB      Ectopic      Multiple      Live Births  5          Past Medical History:  Diagnosis Date   Gestational diabetes    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 Social History:  reports that she has never smoked. She has never used smokeless tobacco. She reports that she does not drink alcohol and does not use drugs.     Maternal Diabetes: Yes:  Diabetes Type:  Pre-pregnancy Genetic Screening: Normal Maternal Ultrasounds/Referrals: Other: Fetal Ultrasounds or other Referrals:  Referred to Materal Fetal Medicine  Maternal Substance Abuse:  No Significant Maternal Medications:  None Significant Maternal Lab Results:  Other:  Other Comments:   Uncontrolled diabetes. 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 FOB not tested. GBS declined, now pending. Left ventriculomegaly->resolved.    Review of Systems  Constitutional:  Negative for chills and fever.  Eyes:  Negative for visual disturbance.  Respiratory:  Negative for cough and shortness of breath.   Gastrointestinal:  Negative for abdominal pain, nausea and vomiting.   Genitourinary:  Negative for vaginal bleeding and vaginal discharge.  Neurological:  Negative for headaches.  Maternal Medical History:  Reason for admission: Nausea. Induction of labor  Contractions: Frequency: irregular.   Perceived severity is mild.   Fetal activity: Perceived fetal activity is normal.   Prenatal complications: PIH.   No infection.   Prenatal Complications - Diabetes: gestational. Declined Diabetes monitoring and Tx Dilation: 4 Effacement (%): 50 Exam by:: V Raeqwon Lux CNM Blood pressure (!) 141/96, pulse 92, temperature 97.8 F (36.6 C), temperature source Oral, resp. rate 16, weight 88.1 kg, last menstrual period 01/04/2020, unknown if currently breastfeeding. Maternal Exam:  Uterine Assessment: Contraction strength is mild.  Contraction frequency is irregular.  Abdomen: Patient reports no abdominal tenderness. Estimated fetal weight is 4200 gm.   Fetal presentation: vertex Introitus: Normal vulva. Vulva is negative for lesion.  Pelvis: questionable for delivery.   Cervix: Cervix evaluated by digital exam.     Fetal Exam Fetal Monitor Review: Baseline rate: 155.  Variability: moderate (6-25 bpm).   Pattern: accelerations present and no decelerations.   Fetal State Assessment: Category I - tracings are normal.  Physical Exam Constitutional:      General: She is not in acute distress.    Appearance: She is obese. She is not ill-appearing or toxic-appearing.  HENT:     Head: Normocephalic.  Eyes:     General: No scleral icterus.    Conjunctiva/sclera: Conjunctivae normal.  Cardiovascular:     Rate and Rhythm: Normal rate.  Pulmonary:     Effort: Pulmonary effort is normal. No respiratory distress.  Abdominal:     Palpations: Abdomen is soft.     Tenderness: There is no abdominal tenderness.  Genitourinary:    General: Normal vulva.  Vulva is no lesion.  Musculoskeletal:     Right lower leg: No edema.     Left lower leg: No edema.  Skin:     General: Skin is warm and dry.  Neurological:     General: No focal deficit present.     Mental Status: She is alert and oriented to person, place, and time.  Psychiatric:        Mood and Affect: Mood normal.    Prenatal labs: ABO, Rh: --/--/A POS (06/12 6387) Antibody: NEG (06/12 0828) Rubella: 4.63 (01/12 1126) RPR: Non Reactive (05/02 1001)  HBsAg: Negative (01/12 1126)  HIV: Non Reactive (05/02 1001)  GBS: Negative/-- (07/16 1106)   Assessment: 1. Labor: IOL. Start pitocin 2x2. AROM PRN 2. Fetal Wellbeing: Category I  3. Pain Control: Plans epidural 4. GBS: Unknown. Agrees to PCR. 5. 37.0 week IUP 6. Uncontrolled A2/B DM  7. Suspected fetal macrosomia w/ Hx severe shoulder dystocia in same-sized baby. Reviewed high risk of shoulder dystocia again including fetal injury that may be permanent or result in death and maternal injury. Offered primary C/S. Pt and partner verbalize understanding and decline C/S.  8. GHTN. No Sx of Pre-E.   Plan:  1. Admit to BS per consult with MD 2. Routine L&D orders 3. Analgesia/anesthesia PRN  4. GBS PCR. Tx PRN. 5. CBGs 4 hrs. Tx PRN if pt agreeable.  6. Anticipate shoulder dystocia. Dr. Donavan Foil notified of Hx and pt continuing to decline C/S. Plan close consultation during labor and delivery.  7. Pre-E labs.    Dorathy Kinsman 09/19/2020, 10:39 AM

## 2020-09-19 NOTE — Anesthesia Preprocedure Evaluation (Addendum)
Anesthesia Evaluation  Patient identified by MRN, date of birth, ID band Patient awake    Reviewed: Allergy & Precautions, NPO status   Airway Mallampati: II  TM Distance: >3 FB Neck ROM: Full    Dental  (+) Teeth Intact   Pulmonary neg pulmonary ROS,    Pulmonary exam normal        Cardiovascular hypertension,  Rhythm:Regular Rate:Normal     Neuro/Psych  Headaches, negative psych ROS   GI/Hepatic negative GI ROS, Neg liver ROS,   Endo/Other  diabetes, Gestational  Renal/GU negative Renal ROS  negative genitourinary   Musculoskeletal negative musculoskeletal ROS (+)   Abdominal (+)  Abdomen: soft. Bowel sounds: normal.  Peds  Hematology negative hematology ROS (+)   Anesthesia Other Findings   Reproductive/Obstetrics (+) Pregnancy                            Anesthesia Physical Anesthesia Plan  ASA: 2  Anesthesia Plan: Epidural   Post-op Pain Management:    Induction:   PONV Risk Score and Plan: 2 and Treatment may vary due to age or medical condition  Airway Management Planned: Natural Airway  Additional Equipment: None  Intra-op Plan:   Post-operative Plan:   Informed Consent: I have reviewed the patients History and Physical, chart, labs and discussed the procedure including the risks, benefits and alternatives for the proposed anesthesia with the patient or authorized representative who has indicated his/her understanding and acceptance.     Dental advisory given  Plan Discussed with:   Anesthesia Plan Comments: (Lab Results      Component                Value               Date                      WBC                      5.8                 09/19/2020                HGB                      10.3 (L)            09/19/2020                HCT                      34.0 (L)            09/19/2020                MCV                      63.8 (L)            09/19/2020                 PLT                      153                 09/19/2020          )  Anesthesia Quick Evaluation  

## 2020-09-19 NOTE — Progress Notes (Signed)
Hypoglycemic Event  CBG: 68   Treatment: 4 oz juice/soda  Symptoms: None  Follow-up CBG: Time:1730  CBG Result: 66  Possible Reasons for Event: Other: Active labor, clear liquid diet  Comments/MD notified: Ivonne Andrew CNM notified    Quindarius [place], [place] M

## 2020-09-19 NOTE — Lactation Note (Signed)
This note was copied from a baby's chart. Lactation Consultation Note  Patient Name: Kaitlin [name] Date: 09/19/2020 Age:32 hours  LC contacted Fara Boros Adcox-RN regarding LC visit after delivery. Per RN, mother declines lactation consult at the moment.   [name] 09/19/2020, 6:34 PM

## 2020-09-19 NOTE — Discharge Summary (Signed)
Postpartum Discharge Summary       Patient Name: Kaitlin [name] DOB: 25-Dec-1990 MRN: [id number]  Date of admission: 09/19/2020 Delivery date:09/19/2020  Delivering provider: Manya Silvas  Date of discharge: 09/21/2020  Admitting diagnosis: Gestational diabetes mellitus (GDM) [O24.419] Intrauterine pregnancy: [redacted]w[redacted]d    Secondary diagnosis:  Principal Problem:   Vaginal delivery Active Problems:   Gestational diabetes mellitus (GDM)   Gestational hypertension, third trimester   Shoulder dystocia during labor and delivery   Anemia complicating pregnancy  Additional problems: Anemia Affecting Pregnancy, Postpartum Hemorrhage    Discharge diagnosis: Term Pregnancy Delivered, Gestational Hypertension, and Uncontrolled A1GDM vs Type 2 DM                                                Post partum procedures: none - offered IV Venofer, declined Augmentation: AROM and Pitocin Complications: [id number] Hospital course: Induction of Labor With Vaginal Delivery   30y.o. yo [phone] [id number] admitted to the hospital 09/19/2020 for induction of labor.  Indication for induction:  GHTN, uncontrolled GDM, suspected fetal macrosomia and Hx severe shoulder dystocia  .  Patient had a labor course as follows: Membrane Rupture Time/Date: 1:34 PM ,09/19/2020   Delivery Method:Vaginal, Spontaneous  w/ 30 second shoulder dystocia. See note.  Episiotomy: None  Lacerations:  None  Details of delivery can be found in separate delivery note.  Patient had a uncomplicated postpartum course. Patient is discharged home 09/21/20.  Newborn Data: Birth date:09/19/2020  Birth time:6:03 PM  Gender:Female  Living status:Living  Apgars:8 ,9  [phone]   Magnesium Sulfate received: No BMZ received: No Rhophylac:N/A MMR:N/A T-DaP: declined Flu: N/A declined Transfusion:No  Physical exam  Vitals:   09/20/20 0900 09/20/20 1300 09/20/20 1542 09/20/20 2126  BP: 128/88 (!) 127/101 125/79 112/80   Pulse: 100 93 90 (!) 101  Resp: '18 18 17 16  ' Temp: 98.7 F (37.1 C) 98.1 F (36.7 C)  98.6 F (37 C)  TempSrc: Oral Oral    SpO2: 99%   99%  Weight:      Height:       General: alert, cooperative, and no distress Lochia: appropriate Uterine Fundus: firm, U-2 Incision: N/A DVT Evaluation: No evidence of DVT seen on physical exam. Negative Homan's sign. No cords or calf tenderness. No significant calf/ankle edema. Labs: Lab Results  Component Value Date   WBC 6.1 09/21/2020   HGB 7.8 (L) 09/21/2020   HCT 26.2 (L) 09/21/2020   MCV 63.9 (L) 09/21/2020   PLT 157 09/21/2020   CMP Latest Ref Rng & Units 09/21/2020  Glucose 70 - 99 mg/dL 98  BUN 6 - 20 mg/dL -  Creatinine 0.44 - 1.00 mg/dL -  Sodium 135 - 145 mmol/L -  Potassium 3.5 - 5.1 mmol/L -  Chloride 98 - 111 mmol/L -  CO2 22 - 32 mmol/L -  Calcium 8.9 - 10.3 mg/dL -  Total Protein 6.5 - 8.1 g/dL -  Total Bilirubin 0.3 - 1.2 mg/dL -  Alkaline Phos 38 - 126 U/L -  AST 15 - 41 U/L -  ALT 0 - 44 U/L -   Edinburgh Score: Edinburgh Postnatal Depression Scale Screening Tool 01/01/2020  I have been able to laugh and see the funny side of things. 0  I have looked forward with enjoyment to things. 0  I  have blamed myself unnecessarily when things went wrong. 0  I have been anxious or worried for no good reason. 0  I have felt scared or panicky for no good reason. 0  Things have been getting on top of me. 0  I have been so unhappy that I have had difficulty sleeping. 0  I have felt sad or miserable. 0  I have been so unhappy that I have been crying. 0  The thought of harming myself has occurred to me. 0  Edinburgh Postnatal Depression Scale Total 0     After visit meds:  Allergies as of 09/21/2020       Reactions   Skin Bleaching [hydroquinone]         Medication List     STOP taking these medications    aspirin 81 MG chewable tablet   metroNIDAZOLE 500 MG tablet Commonly known as: FLAGYL    promethazine 25 MG tablet Commonly known as: PHENERGAN       TAKE these medications    ascorbic acid 500 MG tablet Commonly known as: VITAMIN C Take 1 tablet (500 mg total) by mouth 2 (two) times daily. Take with iron pill   Blood Pressure Kit Devi 1 kit by Does not apply route once a week.   ferrous sulfate 325 (65 FE) MG tablet Take 1 tablet (325 mg total) by mouth 2 (two) times daily with a meal. What changed: when to take this   ibuprofen 600 MG tablet Commonly known as: ADVIL Take 1 tablet (600 mg total) by mouth every 6 (six) hours.         Discharge home in stable condition Infant Feeding: Bottle and Breast Infant Disposition:home with mother Discharge instruction: per After Visit Summary and Postpartum booklet. Activity: Advance as tolerated. Pelvic rest for 6 weeks.  Diet: routine diet Future Appointments: Future Appointments  Date Time Provider [place]  09/22/2020  1:40 PM [place] None  11/01/2020  9:15 AM [facility] None  11/01/2020  9:30 AM Constant, Peggy, MD [facility] None   Follow up Visit:   Please schedule this patient for a In person postpartum visit in 6 weeks with the following provider: MD. Additional Postpartum F/U: 2 hour GTT at 6 weeks and BP check 2-3 days  High risk pregnancy complicated by: GDM and HTN Delivery mode:  Vaginal, Spontaneous  Anticipated Birth Control:  POPs   09/21/2020 Laury Deep, CNM

## 2020-09-20 LAB — CBC
HCT: 27.5 % — ABNORMAL LOW (ref 36.0–46.0)
Hemoglobin: 8.5 g/dL — ABNORMAL LOW (ref 12.0–15.0)
MCH: 19.5 pg — ABNORMAL LOW (ref 26.0–34.0)
MCHC: 30.9 g/dL (ref 30.0–36.0)
MCV: 63.2 fL — ABNORMAL LOW (ref 80.0–100.0)
Platelets: 157 10*3/uL (ref 150–400)
RBC: 4.35 MIL/uL (ref 3.87–5.11)
RDW: 21.8 % — ABNORMAL HIGH (ref 11.5–15.5)
WBC: 8.7 10*3/uL (ref 4.0–10.5)
nRBC: 0 % (ref 0.0–0.2)

## 2020-09-20 LAB — HEMOGLOBIN A1C
Hgb A1c MFr Bld: 6.5 % — ABNORMAL HIGH (ref 4.8–5.6)
Mean Plasma Glucose: 140 mg/dL

## 2020-09-20 NOTE — Progress Notes (Signed)
POSTPARTUM PROGRESS NOTE  Post Partum Day 32  Subjective:  Kaitlin [name] is a 30 y.o. [id number] s/p SVD at [redacted]w[redacted]d.  No acute events overnight.  Pt denies problems with ambulating, voiding or po intake.  She denies nausea or vomiting.  Pain is well controlled.  She has not had flatus. She has not had bowel movement.  Lochia Minimal.   Objective: Blood pressure 116/80, pulse 85, temperature 98 F (36.7 C), temperature source Oral, resp. rate 18, height 5\' 4"  (1.626 m), weight 88.1 kg, last menstrual period 01/04/2020, SpO2 100 %, unknown if currently breastfeeding.  Physical Exam:  General: alert, cooperative and no distress Chest: no respiratory distress Heart:regular rate Abdomen: soft, nontender,  Uterine Fundus: firm, appropriately tender DVT Evaluation: No calf swelling or tenderness Extremities: no edema Skin: warm, dry  Recent Labs    09/19/20 1933 09/20/20 0432  HGB 8.8* 8.5*  HCT 29.2* 27.5*    Assessment/Plan: Kaitlin [name] is a 30 y.o. 38 s/p SVD at [redacted]w[redacted]d   PPD#1 - Doing well GBS positive: positive PCR, not treated Contraception: POPs Feeding: breast and bottle Dispo: Plan for discharge likely tomorrow due to baby (untreated GBS)    LOS: 1 day   [redacted]w[redacted]d, MD 09/20/2020, 8:20 AM

## 2020-09-20 NOTE — Transfer of Care (Signed)
Immediate Anesthesia Transfer of Care Note  Patient: Kaitlin [name]  Procedure(s) Performed: AN AD HOC LABOR EPIDURAL  Patient Location: PACU  Anesthesia Type:Spinal  Level of Consciousness: awake  Airway & Oxygen Therapy: Patient Spontanous Breathing  Post-op Assessment: Report given to RN  Post vital signs: Reviewed and stable  Last Vitals:  Vitals Value Taken Time  BP    Temp    Pulse    Resp    SpO2      Last Pain:  Vitals:   09/20/20 1300  TempSrc: Oral  PainSc:          Complications: No notable events documented.

## 2020-09-21 DIAGNOSIS — O99019 Anemia complicating pregnancy, unspecified trimester: Secondary | ICD-10-CM | POA: Diagnosis present

## 2020-09-21 LAB — CBC
HCT: 26.2 % — ABNORMAL LOW (ref 36.0–46.0)
Hemoglobin: 7.8 g/dL — ABNORMAL LOW (ref 12.0–15.0)
MCH: 19 pg — ABNORMAL LOW (ref 26.0–34.0)
MCHC: 29.8 g/dL — ABNORMAL LOW (ref 30.0–36.0)
MCV: 63.9 fL — ABNORMAL LOW (ref 80.0–100.0)
Platelets: 157 10*3/uL (ref 150–400)
RBC: 4.1 MIL/uL (ref 3.87–5.11)
RDW: 22.3 % — ABNORMAL HIGH (ref 11.5–15.5)
WBC: 6.1 10*3/uL (ref 4.0–10.5)
nRBC: 0 % (ref 0.0–0.2)

## 2020-09-21 LAB — GLUCOSE, RANDOM: Glucose, Bld: 98 mg/dL (ref 70–99)

## 2020-09-21 MED ORDER — ASCORBIC ACID 500 MG PO TABS: 500.0000 mg | ORAL_TABLET | Freq: Two times a day (BID) | ORAL | 3 refills | Status: AC

## 2020-09-21 MED ORDER — SODIUM CHLORIDE 0.9 % IV SOLN
500.0000 mg | Freq: Once | INTRAVENOUS | Status: DC
  Filled 2020-09-21: qty 25

## 2020-09-21 MED ORDER — IBUPROFEN 600 MG PO TABS: 600.0000 mg | ORAL_TABLET | Freq: Four times a day (QID) | ORAL | 0 refills | Status: AC

## 2020-09-21 MED ORDER — FERROUS SULFATE 325 (65 FE) MG PO TABS: 325.0000 mg | ORAL_TABLET | Freq: Two times a day (BID) | ORAL | 5 refills | Status: AC

## 2020-09-21 NOTE — Anesthesia Postprocedure Evaluation (Signed)
Anesthesia Post Note  Patient: Kaitlin [name]  Procedure(s) Performed: AN AD HOC LABOR EPIDURAL     Patient location during evaluation: Mother Baby Anesthesia Type: Epidural Level of consciousness: awake and alert Pain management: pain level controlled Vital Signs Assessment: post-procedure vital signs reviewed and stable Respiratory status: spontaneous breathing, nonlabored ventilation and respiratory function stable Cardiovascular status: stable Postop Assessment: no headache, no backache and epidural receding Anesthetic complications: no   No notable events documented.  Last Vitals:  Vitals:   09/20/20 1542 09/20/20 2126  BP: 125/79 112/80  Pulse: 90 (!) 101  Resp: 17 16  Temp:  37 C  SpO2:  99%    Last Pain:  Vitals:   09/21/20 0700  TempSrc:   PainSc: 5                  [name] P Candela Krul

## 2020-09-21 NOTE — Progress Notes (Addendum)
Patient states pain is 5 out of 10  Tylenol offered but refused. Patient told the plan of care. No leg pain noted. I called Pharmacy to verify discontinuation of Venofer. Order discontinued by R. Arita Miss. Patient going home. All questions answered. Patient is up and ambulating in room no dizzyness noted.

## 2020-09-22 ENCOUNTER — Ambulatory Visit: Payer: Medicaid Other

## 2020-09-22 ENCOUNTER — Telehealth: Payer: Self-pay

## 2020-09-22 NOTE — Telephone Encounter (Signed)
Transition Care Management Follow-up Telephone Call Date of discharge and from where: 09/21/2020 from [facility] How have you been since you were released from the hospital? Pt states that she is feeling well and has no quest Any questions or concerns? No  Items Reviewed: Did the pt receive and understand the discharge instructions provided? Yes  Medications obtained and verified? Yes  Other? No  Any new allergies since your discharge? No  Dietary orders reviewed? No Do you have support at home? Yes   Functional Questionnaire: (I = Independent and D = Dependent) ADLs: I  Bathing/Dressing- I  Meal Prep- I  Eating- I  Maintaining continence- I  Transferring/Ambulation- I  Managing Meds- I   Follow up appointments reviewed:  PCP Hospital f/u appt confirmed? No   Specialist Hospital f/u appt confirmed? Yes  Scheduled to see [facility] on 11/01/2020 @ 09:30am. Are transportation arrangements needed? No  If their condition worsens, is the pt aware to call PCP or go to the Emergency Dept.? Yes Was the patient provided with contact information for the PCP's office or ED? Yes Was to pt encouraged to call back with questions or concerns? Yes

## 2020-09-23 ENCOUNTER — Other Ambulatory Visit: Payer: Medicaid Other

## 2020-11-01 ENCOUNTER — Other Ambulatory Visit: Payer: Medicaid Other

## 2020-11-01 ENCOUNTER — Ambulatory Visit: Payer: Medicaid Other | Admitting: Obstetrics and Gynecology

## 2021-07-27 IMAGING — US US MFM FETAL BPP W/O NON-STRESS
1 series · 13 of 22 positions shown · non-contrast
Comparison: none

[Series 1: us mfm fetal bpp w/o non-stress · 22 acquisitions, 13 frames shown]
[im 1/22]
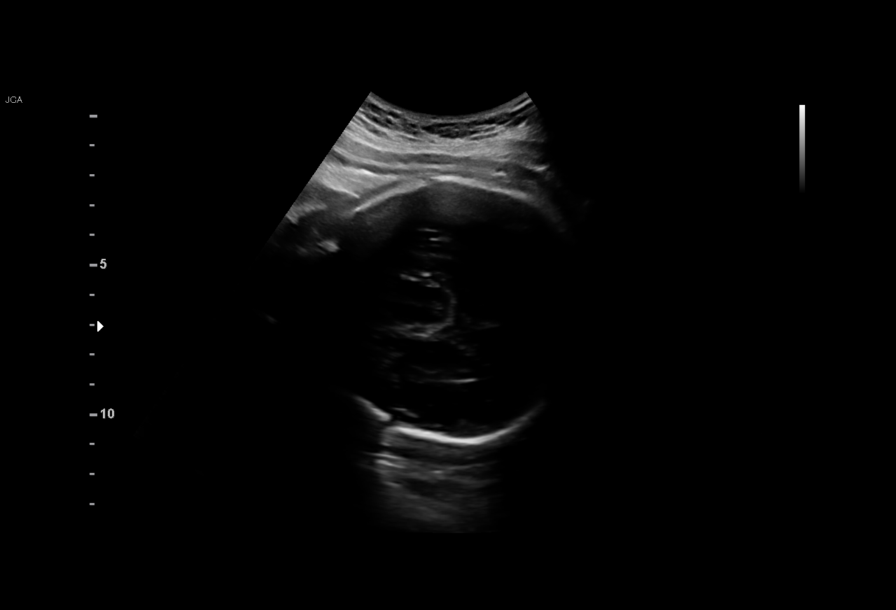
[im 3/22]
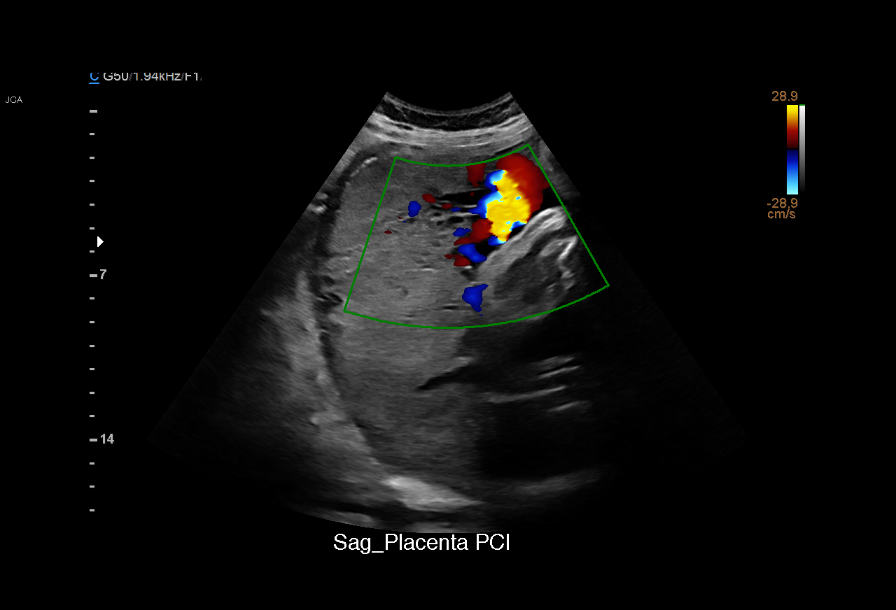
[im 5/22]
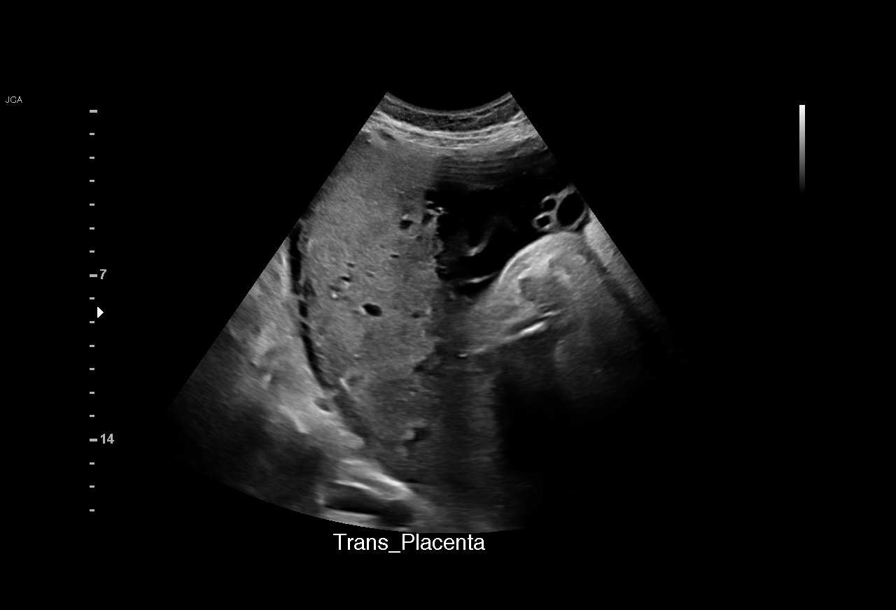
[im 6/22]
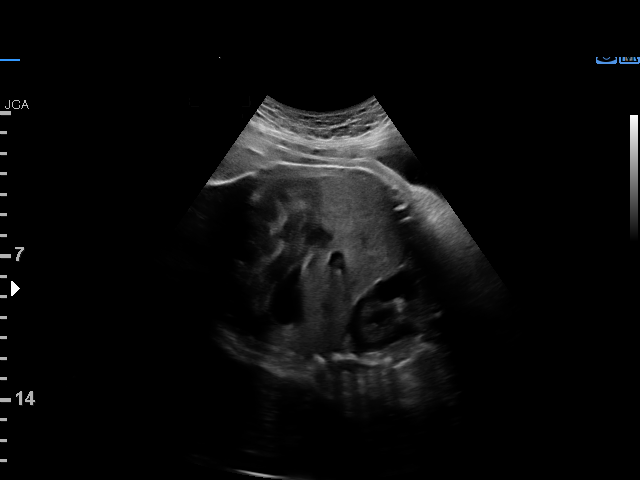
[im 8/22]
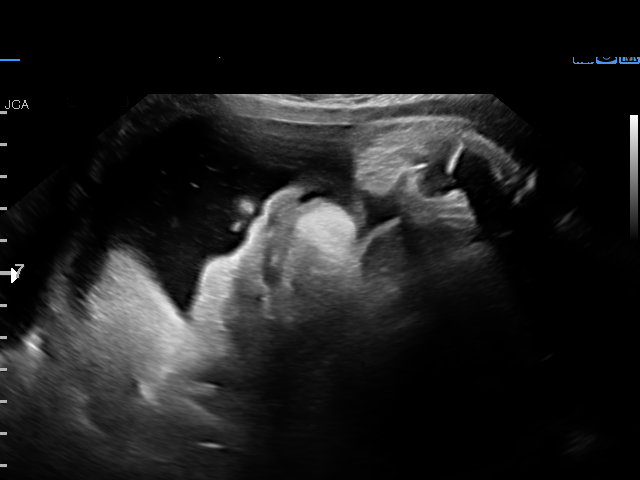
[im 10/22]
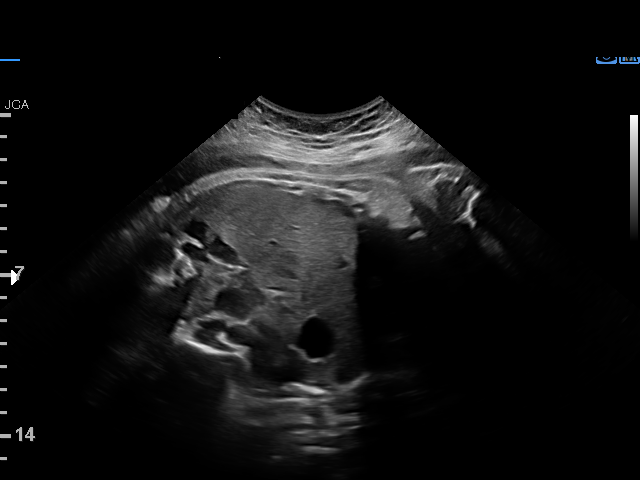
[im 12/22]
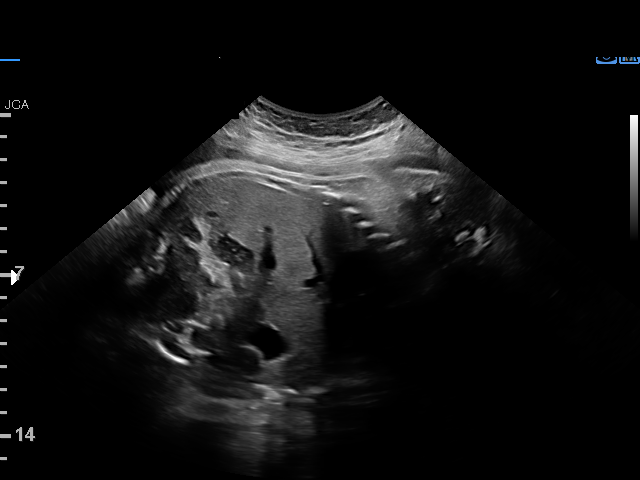
[im 13/22]
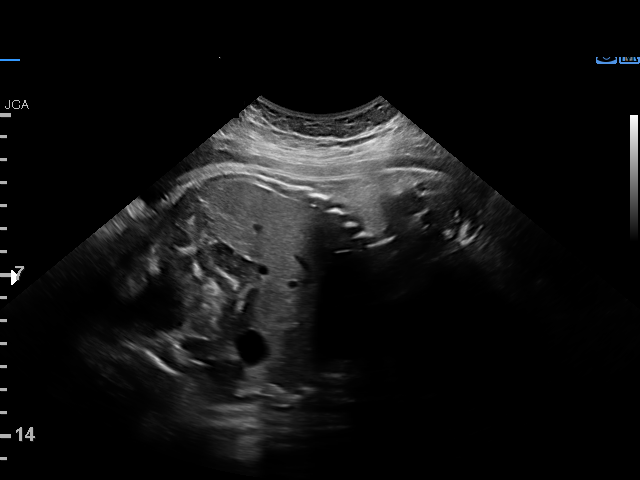
[im 15/22]
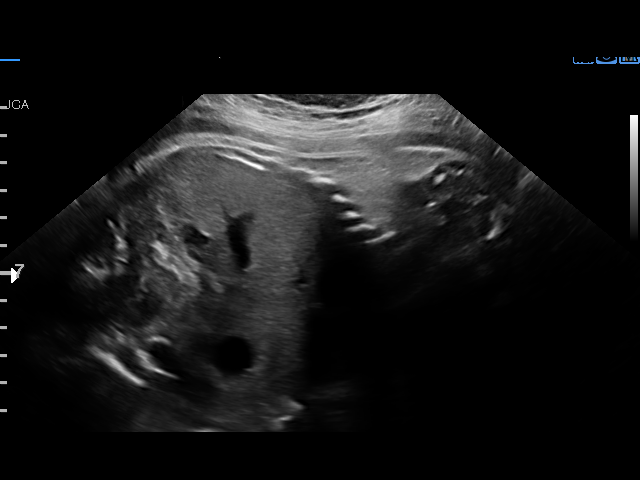
[im 17/22]
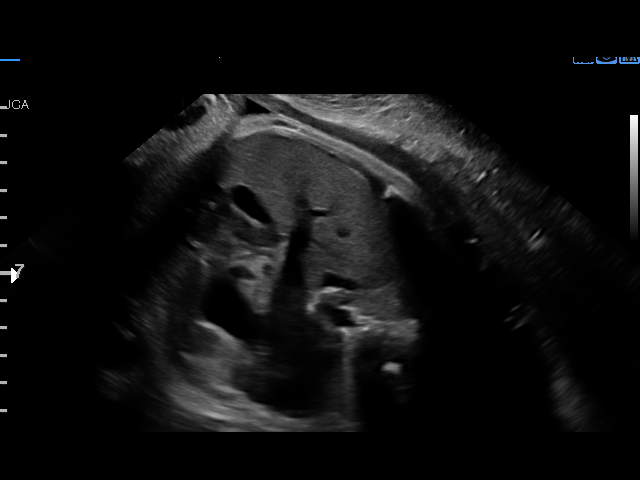
[im 18/22]
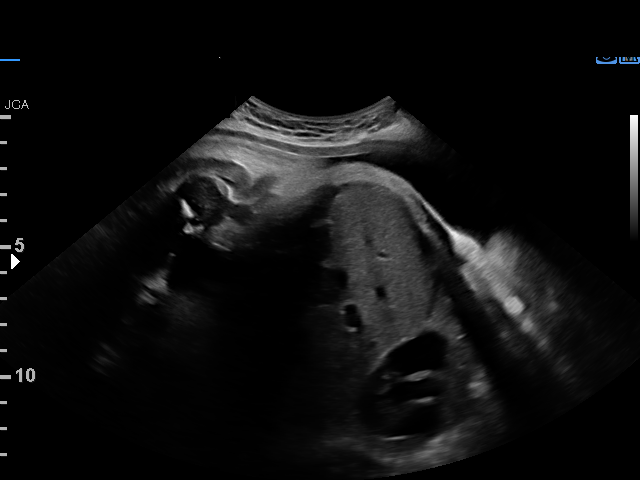
[im 20/22]
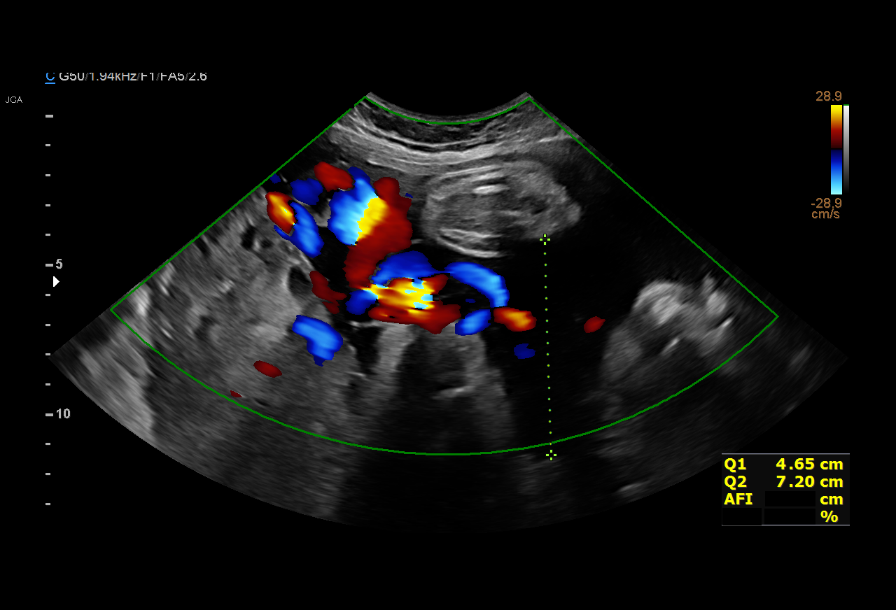
[im 22/22]
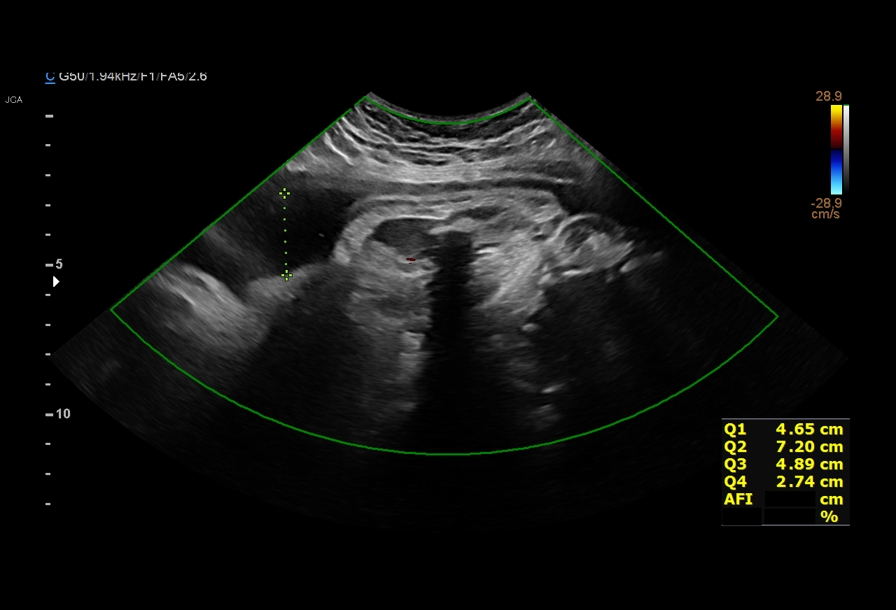

[13 of 22 positions shown; findings below may reference images not displayed]

Indications

 Gestational diabetes in pregnancy,
 controlled by oral hypoglycemic drugs
 suspected poor fetal growth, second
 trimester, (patient states she is sure about
 her LMP)
 Obesity complicating pregnancy, second
 trimester (BMI 31)
 Poor obstetric history: Previous preterm
 delivery, antepartum (@36w, has had 3 full
 term pregnancies since)
 Marginal insertion of umbilical cord affecting
 management of mother in second trimester
 34 weeks gestation of pregnancy
Vital Signs

                                                Height:        5'4"
Fetal Evaluation

 Num Of Fetuses:          1
 Fetal Heart Rate(bpm):   162
 Cardiac Activity:        Observed
 Presentation:            Cephalic
 Placenta:                Posterior Fundal
 P. Cord Insertion:       Visualized

 Amniotic Fluid
 AFI FV:      Within normal limits

 AFI Sum(cm)     %Tile       Largest Pocket(cm)
 19.5            73
 RUQ(cm)       RLQ(cm)       LUQ(cm)        LLQ(cm)

Biophysical Evaluation

 Amniotic F.V:   Within normal limits       F. Tone:         Observed
 F. Movement:    Observed                   Score:           [DATE]
 F. Breathing:   Observed
OB History

 Gravidity:    5         Term:   3        Prem:   1
 Living:       4
Gestational Age

 LMP:           36w 5d        Date:  01/26/19                 EDD:   11/02/19
 Best:          34w 1d     Det. By:  U/S  (06/24/19)          EDD:   11/20/19
Impression

 Antenatal testing due to maternal [id number]
 Biophysical profile [DATE] with normal amniotic fluid and fetal
 movement.
Recommendations

 Continue weekly testing .
 Repeat growth in 3 weeks.

## 2021-11-30 NOTE — Unmapped (Deleted)
The patient called me and informed me that she was in OB/Triage RO yeast infection.  Pt was requesting a MD call in a RX for yeast because the DR. Was in the OR and would not be out before 11:00 am and she needed to leave and go to work.Pt encouraged to wait, purchase Monistat cream or schedule an urgent appointment.  Pt's nurse was in the room and she said she will call be back.  Pt called me back and informed me that she was going to leave and purchase Monistat cream.

## 2021-12-01 ENCOUNTER — Ambulatory Visit: Admit: 2021-12-01 | Payer: Medicaid (Managed Care) | Attending: Maternal & Fetal Medicine

## 2021-12-01 DIAGNOSIS — Z3687 Encounter for antenatal screening for uncertain dates: Secondary | ICD-10-CM

## 2021-12-01 NOTE — Unmapped (Signed)
Ultrasound billing only

## 2022-01-13 ENCOUNTER — Ambulatory Visit: Payer: Medicaid (Managed Care)

## 2022-01-24 ENCOUNTER — Ambulatory Visit: Payer: Medicaid (Managed Care)

## 2022-01-25 ENCOUNTER — Ambulatory Visit: Payer: Medicaid (Managed Care)

## 2022-01-31 ENCOUNTER — Ambulatory Visit: Payer: Medicaid (Managed Care)

## 2022-02-03 ENCOUNTER — Ambulatory Visit: Admit: 2022-02-03 | Payer: Medicaid (Managed Care)

## 2022-02-03 DIAGNOSIS — Z3689 Encounter for other specified antenatal screening: Secondary | ICD-10-CM

## 2022-02-03 NOTE — Unmapped (Signed)
Ultrasound billing only

## 2022-02-14 ENCOUNTER — Ambulatory Visit: Payer: Medicaid (Managed Care)

## 2022-04-10 NOTE — L&D Delivery Note (Signed)
Delivery Information for Girl Emily Haynes    Labor Events:    Preterm labor: No   Labor onset date:     Labor onset time:     Antenatal steroids:     Rupture date: 06/10/2022   Rupture time: 8:30 AM   Rupture type: Artificial   Fluid Color: Clear   Dilation complete date:     Dilation complete time:     Induction: Oxytocin;AROM   Induction indication: Elective;Mild Preeclampsia;Hypertension   Augmentation:     Pushing Started:     Labor Complications     Additional Complications:     Cervical ripening:            Antibiotics received during labor?         Labor Length:    First Stage:   hours,   minutes    Second Stage:   hours,   minutes   Third Stage: 0 hours, 5 minutes       Delivery:    Episiotomy: None       Laceration Repaired?   Pitocin started within 60 seconds of delivery: No    Perineal: None     Periurethral:       Labial:       Sulcus:       Vaginal:       Cervical:           Repair suture: None   Repair # of packets:     Blood loss (ml):     Counts          Forceps attempted? No   Vacuum extractor attempted No   Indications:     Forceps application location:         Asst Del Details (If applicable): No data filed          Presentation/Position: Vertex;            Anesthesia:    Method:  Epidural   Analgesics:           Other Procedures: None    Birth information:  Date of birth: 06/11/2024   Time of birth: 1:50 AM   Sex: female   Delivery type: C-Section, Low Transverse   Breech type (if applicable):     Observed anomalies/comments       Gestational Age: [redacted]w[redacted]d      Living? Living          APGARS  One minute Five minutes Ten minutes Fifteen minutes Twenty minutes   Skin color: 0   0             Heart rate: 2   2             Grimace: 2   2              Muscle tone: 2   2              Breathing: 2   2              Totals: 8   8                  Shoulder Dystocia:   Shoulder Dystocia    Shoulder dystocia present: No   Delivery of posterior arm:        Fetal clavicular fracture:        Gaskin maneuver:         McRoberts maneuver:        Truddie Coco  maneuver:        Suprapubic pressure:        Symphysiotomy:        Woods screw maneuver:        Zavanelli maneuver:                             Newborn Measurements:  Weight: 5533 g (12 lb 3.2 oz)    Length: 54 cm (21.25)    Head circumference (in): 14.567    Chest circumference (in):     Observed anomalies:     Date of Fetal Demise:   Weight: 195.2 oz Length: 21.25     Head circumference: 0.37 m             Cord Information: 3 vessels     Disposition of cord blood: Discarded    Blood gases sent? Yes  Complications: None   Cord clamped date/time:      Resuscitation: Suctioning     Placenta:  Delivered: 06/11/2022  1:55 AM  Appearance: Intact  Removal: Expressed    Disposition: pathology       Delivery Providers:    Delivery Clinician: Benson Setting    Other Providers: Althea Grimmer, RANDY M;ROSE, REBECCA E;RECCHIA, ASHLI;GOEL, KRITI Delivery Nurse;Charge Nurse;Baby Nurse;Technician;Resident

## 2022-05-09 NOTE — Unmapped (Signed)
Called pt to set up NPV with DAPP.  No answer.  Left call back number.    Neaveh Belanger Leslee Home, RN, BSN/CDE, MSW

## 2022-05-17 ENCOUNTER — Ambulatory Visit: Admit: 2022-05-17 | Payer: Medicaid (Managed Care)

## 2022-05-17 DIAGNOSIS — O26843 Uterine size-date discrepancy, third trimester: Secondary | ICD-10-CM

## 2022-05-17 DIAGNOSIS — Z3490 Encounter for supervision of normal pregnancy, unspecified, unspecified trimester: Secondary | ICD-10-CM

## 2022-05-17 NOTE — Progress Notes (Signed)
Ultrasound billing only

## 2022-05-17 NOTE — Progress Notes (Signed)
Patient presents to front desk at [facility] today to schedule DAPP NPV. Pt had been called multiple times and letter sent with no callback or reply. Pt is [redacted]w[redacted]d, and states that she was unable to make offered appointments. Pt states she has a meter, test strips, and supplies to check blood sugars. Advised pt to check FBG and post-prandials. DAPP NPV scheduled for Monday via phone. Pt expressed understanding and agreed to plan.

## 2022-05-22 ENCOUNTER — Ambulatory Visit: Payer: Medicaid (Managed Care) | Attending: Family

## 2022-06-01 ENCOUNTER — Ambulatory Visit: Admit: 2022-06-01 | Payer: Medicaid (Managed Care)

## 2022-06-01 DIAGNOSIS — O0993 Supervision of high risk pregnancy, unspecified, third trimester: Secondary | ICD-10-CM

## 2022-06-01 DIAGNOSIS — O99810 Abnormal glucose complicating pregnancy: Secondary | ICD-10-CM

## 2022-06-01 NOTE — Progress Notes (Signed)
Ultrasound billing only

## 2022-06-09 ENCOUNTER — Inpatient Hospital Stay
Admission: EM | Admit: 2022-06-09 | Discharge: 2022-06-13 | Disposition: A | Discharge status: Home / Self Care | Payer: Medicaid (Managed Care)

## 2022-06-09 DIAGNOSIS — O114 Pre-existing hypertension with pre-eclampsia, complicating childbirth: Secondary | ICD-10-CM

## 2022-06-09 MED ORDER — methylergonovine (METHERGINE) injection 0.2 mg
0.2 | INTRAMUSCULAR | Status: AC | PRN
Start: 2022-06-09 — End: 2022-06-11

## 2022-06-09 MED ORDER — lidocaine-EPINEPHrine 1.5 %-1:200,000 injection
1.5 | INTRAMUSCULAR | Status: AC
Start: 2022-06-09 — End: 2022-06-09
  Administered 2022-06-10: 05:00:00 3 via EPIDURAL

## 2022-06-09 MED ORDER — carboprost (HEMABATE) injection 250 mcg
250 | INTRAMUSCULAR | PRN
Start: 2022-06-09 — End: 2022-06-11
  Administered 2022-06-11: 10:00:00 250 ug via INTRAMUSCULAR

## 2022-06-09 MED ORDER — oxytocin (PITOCIN) 30 units in lactated ringers 500 mL IV BOLUS
30 | Freq: Once | INTRAVENOUS | Status: AC | PRN
Start: 2022-06-09 — End: 2022-06-11

## 2022-06-09 MED ORDER — tranexamic acid (CYCLOKAPRON) IV Push - for Post-Partum Hemorrhage 1,000 mg
1000 | INTRAVENOUS | Status: AC | PRN
Start: 2022-06-09 — End: 2022-06-11
  Administered 2022-06-11: 09:00:00 1000 mg via INTRAVENOUS

## 2022-06-09 MED ORDER — lactated ringers IV bolus
INTRAVENOUS | Status: AC | PRN
Start: 2022-06-09 — End: 2022-06-09
  Administered 2022-06-10: 04:00:00 1000 via INTRAVENOUS

## 2022-06-09 MED ORDER — lidocaine (PF) 2% (20 mg/mL) Soln 20 mg
20 | Freq: Once | INTRAMUSCULAR | Status: AC | PRN
Start: 2022-06-09 — End: 2022-06-09

## 2022-06-09 MED ORDER — lactated Ringers IV infusion
INTRAVENOUS | Status: AC | PRN
Start: 2022-06-09 — End: 2022-06-11
  Administered 2022-06-10: 16:00:00 125 mL/h via INTRAVENOUS

## 2022-06-09 MED ORDER — miSOPROStoL (CYTOTEC) tablet 800 mcg
200 | Freq: Once | ORAL | Status: AC | PRN
Start: 2022-06-09 — End: 2022-06-09

## 2022-06-09 MED ORDER — oxytocin in lactated ringers IV infusion
30 | INTRAVENOUS | Status: AC
Start: 2022-06-09 — End: 2022-06-11
  Administered 2022-06-10: 07:00:00 1 m[IU]/min via INTRAVENOUS
  Administered 2022-06-10: 17:00:00 20 m[IU]/min via INTRAVENOUS
  Administered 2022-06-10: 16:00:00 18 m[IU]/min via INTRAVENOUS
  Administered 2022-06-10: 13:00:00 12 m[IU]/min via INTRAVENOUS
  Administered 2022-06-10: 15:00:00 14 m[IU]/min via INTRAVENOUS
  Administered 2022-06-10: 21:00:00 10 m[IU]/min via INTRAVENOUS

## 2022-06-09 MED ORDER — ROPivacaine (PF) (NAROPIN) 2 mg/mL (0.2 %) epidural
2 | INTRAMUSCULAR | Status: AC
Start: 2022-06-09 — End: ?

## 2022-06-09 MED ORDER — ROPivacaine (PF) (NAROPIN) 2 mg/mL (0.2 %) epidural
2 | INTRAMUSCULAR | PRN
Start: 2022-06-09 — End: 2022-06-11
  Administered 2022-06-10: 13:00:00 5
  Administered 2022-06-10: 05:00:00 2
  Administered 2022-06-10: 05:00:00 4

## 2022-06-09 MED ORDER — oxytocin (PITOCIN) 30 units in lactated ringers 500 mL IV BOLUS
30 | INTRAVENOUS | Status: AC | PRN
Start: 2022-06-09 — End: 2022-06-11

## 2022-06-09 MED ORDER — ondansetron (ZOFRAN) injection 4 mg
4 | Freq: Three times a day (TID) | INTRAMUSCULAR | Status: AC | PRN
Start: 2022-06-09 — End: 2022-06-11

## 2022-06-09 MED ORDER — lactated Ringers IV infusion
INTRAVENOUS | Status: AC
Start: 2022-06-09 — End: 2022-06-11
  Administered 2022-06-10 (×4): 125 mL/h via INTRAVENOUS

## 2022-06-09 MED ORDER — dexamethasone (DECADRON) injection 4 mg
4 | Freq: Once | INTRAMUSCULAR | Status: AC | PRN
Start: 2022-06-09 — End: 2022-06-09

## 2022-06-09 MED ORDER — oxytocin in lactated ringers 30 unit/500 mL IV infusion
30 | INTRAVENOUS | Status: AC
Start: 2022-06-09 — End: ?

## 2022-06-09 MED ORDER — ondansetron (ZOFRAN) injection 4 mg
4 | Freq: Three times a day (TID) | INTRAMUSCULAR | Status: AC | PRN
Start: 2022-06-09 — End: 2022-06-11
  Administered 2022-06-10: 07:00:00 4 mg via INTRAVENOUS

## 2022-06-09 MED ORDER — ROPivacaine (PF) (NAROPIN) 2 mg/mL (0.2 %) epidural
2 | INTRAMUSCULAR | PRN
Start: 2022-06-09 — End: 2022-06-11
  Administered 2022-06-10: 05:00:00 12 via EPIDURAL

## 2022-06-09 MED ORDER — oxytocin (PITOCIN) injection 10 Units
10 | Freq: Once | INTRAMUSCULAR | Status: AC | PRN
Start: 2022-06-09 — End: 2022-06-09

## 2022-06-09 MED ORDER — peppermint oiL liquid 1 mL
Freq: Once | Status: AC | PRN
Start: 2022-06-09 — End: 2022-06-09

## 2022-06-09 MED ORDER — oxytocin (PITOCIN) 30 units in lactated ringers 500 mL infusion
30 | Freq: Once | INTRAVENOUS | Status: AC | PRN
Start: 2022-06-09 — End: 2022-06-11
  Administered 2022-06-11: 09:00:00 83.3 mL/h via INTRAVENOUS

## 2022-06-09 NOTE — Nursing Note (Signed)
Pt admitted to LDR L for IOL. Oriented to room & call light.   VSS. Placed on CEFM. Labs drawn & sent. Admission completed. OB Nursing  consent signed & scanned by [facility]. has pain. Rating pain 3/10 on pain scale. Cervix dialated. Pt has no other needs at this time.   Call light within reach. RN will continue to monitor.

## 2022-06-09 NOTE — ED Notes (Signed)
Pt c/o ctx pain, for IOL on 3/3. Endorses +FM, denies vb, lof. Changing at this time

## 2022-06-09 NOTE — H&P (Signed)
L&D Upper Level Resident   Admission Note     Mt Healthy    Shanley Demo is a 32 y.o. [id number] at [redacted]w[redacted]d by 11 week [place] admitted for IOL 2/2 SI PreE without SF.     History reviewed. No pertinent past medical history.  History reviewed. No pertinent surgical history.  OB History   Gravida Para Term Preterm AB Living   8 6 5 1 1 6    SAB IAB Ectopic Multiple Live Births     1     6      # Outcome Date GA Lbr Len/2nd Weight Sex Delivery Anes PTL Lv   8 Current            7 Term 09/19/20 [redacted]w[redacted]d   M Vag-Spont   LIV   6 Term 10/30/19 [redacted]w[redacted]d   F Vag-Spont   LIV   5 Term 02/11/18 [redacted]w[redacted]d 39:04 / 00:16 4050 g (8 lb 14.9 oz) F Vag-Spont EPI, Local  LIV      Birth Comments: Born at 23 4/[redacted] weeks gestation with LGA birth weight of 4.05 kg (8 lb 14.9 oz), delivered by Vaginal, Spontaneous. Her mother is Brandstetter,Cydnie S, who is a 34 y.o. [phone] with a pregnancy complicated by elevated 1-hr OGTT w/out follow-up 3-hr test, a...   4 Term 11/02/15 [redacted]w[redacted]d 03:28 / 00:10 4070 g (8 lb 15.6 oz) M Vag-Spont EPI  LIV   3 Term 03/15/13 [redacted]w[redacted]d  3930 g (8 lb 10.6 oz) M Vag-Spont EPI N LIV   2 Preterm 06/11/11 [redacted]w[redacted]d  2580 g (5 lb 11 oz) M Vag-Spont EPI Y LIV      Birth Comments: PPROM   1 IAB 09/28/09               No current facility-administered medications on file prior to encounter.     No current outpatient medications on file prior to encounter.     No Known Allergies    Objective:   Patient Vitals for the past 24 hrs:   BP Pulse SpO2   06/09/22 1807 139/87 108 100 %   06/09/22 1752 140/89 114 100 %       Please see Physical Exam by the [facility] ED Provider    FHT: 145 baseline, mod variability, + accels, - decels  Toco: 3/10 mins     SVE: 1/0/-4 per OB ED    No results found for this or any previous visit (from the past 24 hour(s)).    A/P: Moorea Boissonneault is a 32 y.o. [id number] at [redacted]w[redacted]d by 11 week [place] admitted for IOL 2/2 SI PreE without SF.      SI PreE without SF  - Per chart review, has reported elevated Bps in prior pregnancies and was started on  bASA at 12 weeks with c/f cHTN  - Clinically and hemodynamically stable, mild range BP in clinic and in [facility] ED today  - HELLP labs with P/C 0.61, otherwise normal other than known gest thrombocytopenia  - Given SI PreE without SF, recommended delivery    gDM  - Patient with an elevated 1 hours >200 of 251, ruling in for gestational diabetes  - She declined to see DAPP, meds, or patterning.  - has been counseled in clinic on risk fo DKA, and of stillbirth with diabetes or uncontrolled BG   - Given gDM, hx shoulder dystocia, and EFW today of 4300g on [place], so possibly up to 4700g with error, we recommended pCS. Patient declined.  IOL  - Patient with painful contractions for 1 day  - SVE 1/0/-4 in OB ED  - started with low dose pitocin as declined foley  - consented for CS prn    Gestational Thrombocytopenia  1/12: 127  3/1: 124    Hx Shoulder Dystocia  - in G6 pregnancy after a precipitous delivery, resolved with what sounds like McRoberts and suprapubic pressure. Baby was 9 lbs.  - Declined pCS that was recommended    GBS neg    H/o PPROM and PTD   - In G2 preg at [redacted]w[redacted]d    Fetal status  - Cat 1  - EFW 4262g, AC measuring [redacted]w[redacted]d  (pelvis proven to 3335K)  - cephalic    PNC  - Rh pos  - Rubella immune  - BCM: undecided - pops vs depo    Disposition: L&D    Macie Burows, PGY-3  OB-GYN

## 2022-06-09 NOTE — ED Provider Notes (Signed)
[facility] ED OB  Provider Note      Patient Name: Emily Haynes  MRN: [id number]  Date of Birth: [birth year]  Date of Service: 06/09/2022    Antepartum Care: Mt healthy    Subjective:  Emily Haynes is a 32 y.o. [id number] at [redacted]w[redacted]d by L=12 who presents with contractions. Reports they started yesterday morning and have been q20 min yesterday but today they have gotten closer together q5-56min and more painful.       Denies headache, change in vision, RUQ pain.      She denies LOF, vaginal bleeding. She reports normal fetal movement.        Antepartum course complicated by:      There are no problems to display for this patient.       OB History   Gravida Para Term Preterm AB Living   8 6 5 1 1 6    SAB IAB Ectopic Multiple Live Births     1     6      # Outcome Date GA Lbr Len/2nd Weight Sex Delivery Anes PTL Lv   8 Current            7 Term 09/19/20 [redacted]w[redacted]d   M Vag-Spont   LIV   6 Term 10/30/19 [redacted]w[redacted]d   F Vag-Spont   LIV   5 Term 02/11/18 [redacted]w[redacted]d 39:04 / 00:16 4050 g (8 lb 14.9 oz) F Vag-Spont EPI, Local  LIV      Birth Comments: Born at 41 4/[redacted] weeks gestation with LGA birth weight of 4.05 kg (8 lb 14.9 oz), delivered by Vaginal, Spontaneous. Her mother is Emily Haynes, who is a 45 y.o. [phone] with a pregnancy complicated by elevated 1-hr OGTT w/out follow-up 3-hr test, a...   4 Term 11/02/15 [redacted]w[redacted]d 03:28 / 00:10 4070 g (8 lb 15.6 oz) M Vag-Spont EPI  LIV   3 Term 03/15/13 [redacted]w[redacted]d  3930 g (8 lb 10.6 oz) M Vag-Spont EPI N LIV   2 Preterm 06/11/11 [redacted]w[redacted]d  2580 g (5 lb 11 oz) M Vag-Spont EPI Y LIV      Birth Comments: PPROM   1 IAB 09/28/09               History:  Past Medical History: History reviewed. No pertinent past medical history.  Past Surgical History: History reviewed. No pertinent surgical history.    Social History:   Social History     Socioeconomic History    Marital status: Single     Spouse name: Not on file    Number of children: Not on file    Years of education: Not on file    Highest education level: Not on file    Occupational History    Not on file   Tobacco Use    Smoking status: Never    Smokeless tobacco: Never   Substance and Sexual Activity    Alcohol use: Not on file    Drug use: Not on file    Sexual activity: Not on file   Other Topics Concern    Not on file   Social History Narrative    Not on file     Social Determinants of Health     Financial Resource Strain: Not on file   Food Insecurity: Not on file   Transportation Needs: Not on file   Physical Activity: Not on file   Stress: Not on file   Social Connections: Not on file   Intimate Partner Violence: Not  on file   Housing Stability: Not on file       Family History: No family history on file.    Medications:  No current facility-administered medications on file prior to encounter.     No current outpatient medications on file prior to encounter.       Allergies:  No Known Allergies    Objective:    Vital signs in last 24 hours:  Vitals:    06/09/22 1752 06/09/22 1807   BP: 140/89 139/87   Pulse: 114 108   SpO2: 100% 100%       General: well nourished, well developed, in no apparent distress   HEENT: normocephalic, atraumatic, moist mucous membranes   Pulmonary: unlabored respirations  Cardiac: Regular rate  Abdomen: Soft, non-tender, gravid uterus  GU: normal external genitalia  SVE: 1/t/h  Skin: warm and well perfused, no edema  Neuro: alert and oriented x3  Psych: appropiate mood and affect     Electronic Fetal Monitoring:    Fetal Baseline: 140bpm   Variability: moderate (6-25 beats per minute)   Acceleration: Present   Deceleration: Absent   Uterine Activity:  q5    EFW: 4000 by Leopold'Haynes   Cephalic by bedside US     Lab Review:  Blood type: A pos (9/26)  GBS: neg (2/20)  HIV: non-reactive (1/12)  Trepia: neg (9/26)  GC/CT: neg (9/26)  Rubella: immune (9/26)  GCT: 251 1/12  HepBsAg: neg (9/26)  Hep C Ab: neg (9/26)        Ultrasound:   2/7 ([redacted]w[redacted]d): EFW 3500g (84%),  AC >99%, AFI 25cm, anterior placenta    Impression/Plan: Emily Haynes is a 32 y.o.  [id number] at [redacted]w[redacted]d by L=12 with contractions in setting of cHTN and suspected fetal macrosomia.      Contractions  -Contracting q56min  - SVE: 1/t/h    cHTN  - Per chart review, has reported elevated Bps in prior pregnancies and was started on bASA at 12 weeks with c/f cHTN  - Clinically and hemodynamically stable, mild range BP in clinic and in [facility] ED today  - HELLP labs ordered  - Given cHTN, recommended IOL. Patient agreeable, will be for augmentation on L&D  - Consented for C/Haynes PRN  - Cephalic by BSUS    Fetal Status   - NST reactive  - Leopolds EFW 7902I  - Cephalic by bedside US     H/o shoulder dystocia  - per report, in her G6 pregnancy. Precipitous delivery with shoulder dystocia. That baby was 9lbs per patient  - pelvis proven to 4070g    Gestational thrombocytopenia  - 127 on 1/12  - labs on admission    H/o PPROM and PTD   - In G2 preg at [redacted]w[redacted]d    Elev 1hr GCT  - 251 on 1/12  - declined 3hr GTT and declined beign seen in DAPP/patterning/insulin or medications  - will discuss patterning intrapartum  - has been counseled in clinic on risk fo DKA, and of stillbirth with diabetes or uncontrolled BG     Mild poly  - AFI 25 on 2/7 [place]    Grand multip  - recommend hemorrhage kit in room    GBS neg    Prenatal Care  - Rh pos  - Rubella immune  - BCM: und      Disposition:   - admit to L&D      Discussed with: Dr. Dewaine Oats ([place]) and Dr. Hiram Comber (Attending)    Donnamae Haynes  Jessy Oto, DO  PGY-2, Obstetrics and Gynecology    Future Appointments   Date Time Provider [place]   06/11/2022 10:00 PM [facility] [place], DO  Resident  06/09/22 2012       Harl Favor, DO  Resident  06/10/22 [phone]

## 2022-06-09 NOTE — Anesthesia Pre-Procedure Evaluation (Signed)
[place]  PRE-PROCEDURAL EVALUATION    Emily Haynes is a 32 y.o. year old female presenting for:        Surgeon:   * Surgery not found *    Chief Complaint         Review of Systems     Anesthesia Evaluation    Patient summary reviewed and Previous anesthesia note reviewed.       No history of anesthetic complications   I have reviewed the History and Physical Exam, any relevant changes are noted in the anesthesia pre-operative evaluation.      Cardiovascular:        Hypertension is.      (-) angina.    Neuro/Muscoloskeletal/Psych:    (+) anxiety.      (-) seizures, CVA, back problems, no depression.     Pulmonary:              (-) asthma, shortness of breath, recent URI.       GI/Hepatic/Renal:      GERD is well controlled.      (-) liver disease, renal disease.    Endo/Other:      Diabetes (Reported high BG during prenatal check; states she does not do anything for DM care).    (-) hypothyroidism, hyperthyroidism, no bleeding disorder, no clotting disorder.          Past Medical History     History reviewed. No pertinent past medical history.    Past Surgical History     History reviewed. No pertinent surgical history.    Family History     No family history on file.    Social History     Social History     Socioeconomic History    Marital status: Single     Spouse name: Not on file    Number of children: Not on file    Years of education: Not on file    Highest education level: Not on file   Occupational History    Not on file   Tobacco Use    Smoking status: Never    Smokeless tobacco: Never   Substance and Sexual Activity    Alcohol use: Not on file    Drug use: Not on file    Sexual activity: Not on file   Other Topics Concern    Not on file   Social History Narrative    Not on file     Social Determinants of Health     Financial Resource Strain: Not on file   Food Insecurity: Not on file   Transportation Needs: Not on file   Physical Activity: Not on file   Stress: Not on file    Social Connections: Not on file   Intimate Partner Violence: Not on file   Housing Stability: Not on file       Medications     Allergies:  No Known Allergies    Home Meds:  Prior to Admission medications as of 06/09/22 1808   Not on File       Inpatient Meds:  Scheduled:   Continuous:    lactated ringers      lactated Ringers 125 mL/hr (06/09/22 1929)    lactated Ringers         PRN: carboprost, dexamethasone (DECADRON) IV, lactated ringers, lactated Ringers, lidocaine (PF) 2% (20 mg/mL), methylergonovine, miSOPROStoL, ondansetron, ondansetron, oxytocin (PITOCIN) 30 units in lactated ringers 500 mL, oxytocin (PITOCIN) 30 units in  lactated ringers 500 mL, oxytocin (PITOCIN) 30 units in lactated ringers 500 mL, oxytocin, peppermint oiL, tranexamic acid    Vital Signs     Wt Readings from Last 3 Encounters:   No data found for Wt     Ht Readings from Last 3 Encounters:   No data found for Ht     Temp Readings from Last 3 Encounters:   No data found for Temp     BP Readings from Last 3 Encounters:   06/09/22 139/87     Pulse Readings from Last 3 Encounters:   06/09/22 108     @LASTSAO2 (3)@    Physical Exam     Airway:     Mallampati: III  Mouth Opening: < = 2 FB  TM distance: > = 3 FB  Neck ROM: full    Dental:   - No obvious cracked, loose, chipped, or missing teeth.     Pulmonary:      Breathing: unlabored         Cardiovascular:     Rhythm: regular  Rate: normal    Neuro/Musculoskeletal/Psych:     Mental status: alert and oriented to person, place and time.          Abdominal:       Current OB Status:    Gravida 7+   Para 6     (+) PIH.     Other Findings:        Laboratory Data     Lab Results   Component Value Date    WBC 6.7 06/09/2022    HGB 12.6 06/09/2022    HCT 37.7 06/09/2022    MCV 75.7 (L) 06/09/2022    PLT 124 (L) 06/09/2022       No results found for: [facility]    Lab Results   Component Value Date    GLUCOSE 54 (L) 06/09/2022    BUN 5 (L) 06/09/2022    CO2 19 (L) 06/09/2022    CREATININE 0.58 (L)  06/09/2022    K 3.4 (L) 06/09/2022    NA 138 06/09/2022    CL 101 06/09/2022    CALCIUM 9.4 06/09/2022    ALBUMIN 4.1 06/09/2022    PROT 7.8 06/09/2022    ALKPHOS 112 06/09/2022    ALT 7 06/09/2022    AST 16 06/09/2022    BILITOT 0.6 06/09/2022       No results found for: PTT, INR    No results found for: PREGTESTUR, PREGSERUM, HCG, HCGQUANT    Anesthesia Plan     ASA 3         Female and current non-smoker    Anesthesia Type:  labor epidural.      PONV Risk Factors: female, current non-smoker                    Anesthetic plan and risks discussed with patient.    Plan, alternatives, and risks of anesthesia, including death, have been explained to and discussed with the patient/legal guardian.  By my assessment, the patient/legal guardian understands and agrees.  Scenario presented in detail.  Questions answered.    Blood products not discussed.      Plan discussed with CRNA, attending and RNSA.

## 2022-06-09 NOTE — Anesthesia Procedure Notes (Addendum)
OB Epidural Placement      Reason for block: labor epidural  Diagnosis:  Labor Pain.      Patient location during procedure: L&D room    Pre-procedure Pain Score:  9                  Patient position: sitting      Preanesthetic Checklist  Completed: patient identified, anesthesia consent given, pre-op evaluation, timeout performed, IV checked, risks and benefits discussed, monitors and equipment checked/attached to patient, patient being monitored and hand hygiene performed  Anesthesia risks / alternatives discussed pre-op  Questions answered / anesthesia plan accepted and patient agrees to proceed..  Timeout performed at:  23:35 EST. By:  Lenna Sciara. Abri Vacca  Timeout verification:  correct patient, correct procedure, correct site and allergies reviewed.    Epidural    Prep: ChloraPrep and site prepped and draped                Approach: midline    Start time: 06/09/2022 11:38 PM    Needle     Injection technique: LOR saline    Needle type: Tuohy     Needle gauge: 17    Needle length: 3.5 in (9 cm)  Type:  lumbar epidural   Level of placement:  L3-4        Needle insertion depth: 6.5 cm    Catheter at skin depth: 11 cm  Number of attempts:  1        Test Dose Time:  06/09/2022 11:45 PM  Performed after negative aspiration.       Catheter secured and sterile dressing applied.      Assessment  Events:  blood not aspirated via needle, blood not aspirated via catheter, injection not painful, no injection resistance, no paresthesia with needle placement, no paresthesia with catheter placement, no other event, no positive intravascular test dose, no dural puncture/CSF with needle placement, no dural puncture/CSF with catheter placement and no positive intrathecal test dose                                                           Staffing  Performed: RNSA   Anesthesiologist: Terisa Starr, MD  Resident/CRNA: Olga Coaster, CRNA  Other anesthesia staff: Jac Canavan, RN  Performed by: Olga Coaster, CRNA  Authorized  by: Olga Coaster, CRNA        Medications Administered  lidocaine 1.5%-EPINEPHrine 1:200,000 injection - Epidural   3 mL - 06/09/2022 11:45:00 PMMedication administered at:  06/09/2022 11:45 PM

## 2022-06-10 LAB — POC GLU MONITORING DEVICE
POC Glucose Monitoring Device: 69 mg/dL — ABNORMAL LOW (ref 70–100)
POC Glucose Monitoring Device: 57 mg/dL — ABNORMAL LOW (ref 70–100)
POC Glucose Monitoring Device: 60 mg/dL — ABNORMAL LOW (ref 70–100)
POC Glucose Monitoring Device: 87 mg/dL (ref 70–100)
POC Glucose Monitoring Device: 88 mg/dL (ref 70–100)
POC Glucose Monitoring Device: 68 mg/dL — ABNORMAL LOW (ref 70–100)
POC Glucose Monitoring Device: 59 mg/dL — ABNORMAL LOW (ref 70–100)
POC Glucose Monitoring Device: 57 mg/dL — ABNORMAL LOW (ref 70–100)
POC Glucose Monitoring Device: 64 mg/dL — ABNORMAL LOW (ref 70–100)
POC Glucose Monitoring Device: 65 mg/dL — ABNORMAL LOW (ref 70–100)
POC Glucose Monitoring Device: 70 mg/dL (ref 70–100)

## 2022-06-10 LAB — URINALYSIS W/RFL TO MICROSCOPIC
Bilirubin, UA: NEGATIVE
Glucose, UA: NEGATIVE mg/dL
Ketones, UA: >150 mg/dL — AB
Nitrite, UA: NEGATIVE
Protein, UA: 100 mg/dL — AB
RBC, UA: 10 /HPF — ABNORMAL HIGH (ref 0–3)
Specific Gravity, UA: 1.021 (ref 1.005–1.035)
Squam Epithel, UA: 38 /HPF — ABNORMAL HIGH (ref 0–5)
Urobilinogen, UA: <2 mg/dL (ref 0.2–1.9)
WBC, UA: 97 /HPF — ABNORMAL HIGH (ref 0–5)
pH, UA: 6 (ref 5.0–8.0)

## 2022-06-10 LAB — CBC
Hematocrit: 37.7 % (ref 35.0–45.0)
Hemoglobin: 12.6 g/dL (ref 11.7–15.5)
MCH: 25.3 pg — ABNORMAL LOW (ref 27.0–33.0)
MCHC: 33.5 g/dL (ref 32.0–36.0)
MCV: 75.7 fL — ABNORMAL LOW (ref 80.0–100.0)
MPV: 9.1 fL (ref 7.5–11.5)
Platelets: 124 10*3/uL — ABNORMAL LOW (ref 140–400)
RBC: 4.99 10*6/uL (ref 3.80–5.10)
RDW: 14.4 % (ref 11.0–15.0)
WBC: 6.7 10*3/uL (ref 3.8–10.8)

## 2022-06-10 LAB — OB URINE DRUG SCREEN REFLEX TO CONFIRMATION
Amphetamine, 500 ng/mL Cutoff: NEGATIVE
Barbiturates UR, 300  ng/mL Cutoff: NEGATIVE
Benzodiazepines UR, 300 ng/mL Cutoff: NEGATIVE
Buprenorphine, 5 ng/mL Cutoff: NEGATIVE
Cocaine UR, 300 ng/mL Cutoff: NEGATIVE
Fentanyl, 2 ng/mL Cutoff: NEGATIVE
Methadone, UR, 300 ng/mL Cutoff: NEGATIVE
Opiates UR, 300 ng/mL Cutoff: NEGATIVE
Oxycodone, 100 ng/mL Cutoff: NEGATIVE
THC UR, 50 ng/mL Cutoff: NEGATIVE
Tricyclic Antidepressants, 300 ng/mL Cutoff: NEGATIVE

## 2022-06-10 LAB — COMPREHENSIVE METABOLIC PANEL
ALT: 7 U/L (ref 7–52)
AST: 16 U/L (ref 13–39)
Albumin: 4.1 g/dL (ref 3.5–5.7)
Alkaline Phosphatase: 112 U/L (ref 36–125)
Anion Gap: 18 mmol/L — ABNORMAL HIGH (ref 3–16)
BUN: 5 mg/dL — ABNORMAL LOW (ref 7–25)
CO2: 19 mmol/L — ABNORMAL LOW (ref 21–33)
Calcium: 9.4 mg/dL (ref 8.6–10.3)
Chloride: 101 mmol/L (ref 98–110)
Creatinine: 0.58 mg/dL — ABNORMAL LOW (ref 0.60–1.30)
EGFR: >90
Glucose: 54 mg/dL — ABNORMAL LOW (ref 70–100)
Osmolality, Calculated: 281 mOsm/kg (ref 278–305)
Potassium: 3.4 mmol/L — ABNORMAL LOW (ref 3.5–5.3)
Sodium: 138 mmol/L (ref 133–146)
Total Bilirubin: 0.6 mg/dL (ref 0.0–1.5)
Total Protein: 7.8 g/dL (ref 6.4–8.9)

## 2022-06-10 LAB — ABO/RH: Rh Type: POSITIVE

## 2022-06-10 LAB — ANTIBODY SCREEN: Antibody Screen: NEGATIVE

## 2022-06-10 LAB — URIC ACID: Uric Acid: 7.4 mg/dL (ref 3.8–8.7)

## 2022-06-10 LAB — PROTEIN / CREATININE RATIO, URINE
Creatinine, Urine: 131.9 mg/dL
Prot/Creat Ratio, Ur: 0.61 ratio
Total Protein, Ur: 80 mg/dL

## 2022-06-10 LAB — LACTATE DEHYDROGENASE: LD: 202 U/L (ref 110–270)

## 2022-06-10 LAB — TREPONEMA PALLIDUM AB WITH REFLEX: Treponema Pallidum: NEGATIVE

## 2022-06-10 MED ORDER — fentaNYL (SUBLIMAZE) 50 mcg/mL injection
50 | INTRAMUSCULAR | Status: AC
Start: 2022-06-10 — End: ?

## 2022-06-10 MED ORDER — lidocaine (PF) 2% (20 mg/mL) 20 mg/mL (2 %) Soln
20 | INTRAMUSCULAR | Status: AC
Start: 2022-06-10 — End: ?

## 2022-06-10 MED ORDER — BUPivacaine (PF) (MARCAINE) 0.25 % (2.5 mg/mL) injection Soln
0.25 | INTRAMUSCULAR | Status: AC
Start: 2022-06-10 — End: ?

## 2022-06-10 MED ORDER — fentaNYL (SUBLIMAZE) injection
50 | INTRAMUSCULAR | PRN
Start: 2022-06-10 — End: 2022-06-11
  Administered 2022-06-10: 20:00:00 50 via EPIDURAL
  Administered 2022-06-11: 100 via EPIDURAL

## 2022-06-10 MED ORDER — phenylephrine (NEO-SYNEPHRINE) 10 mg/mL injection
10 | INTRAMUSCULAR | Status: AC
Start: 2022-06-10 — End: ?

## 2022-06-10 MED ORDER — oxytocin in lactated ringers 30 unit/500 mL IV infusion
30 | INTRAVENOUS | Status: AC
Start: 2022-06-10 — End: ?

## 2022-06-10 MED ORDER — ondansetron (ZOFRAN) 4 mg/2 mL injection
4 | INTRAMUSCULAR | Status: AC
Start: 2022-06-10 — End: ?

## 2022-06-10 MED ORDER — phenylephrine 100 mcg/mL in NS 0.9% 10 mL IV 100 mcg/ml syringe
100 | INTRAVENOUS | Status: AC
Start: 2022-06-10 — End: ?

## 2022-06-10 MED ORDER — bupivacaine (PF)(SENSORCAINE) 0.25% injection
0.25 | INTRAMUSCULAR | PRN
Start: 2022-06-10 — End: 2022-06-11
  Administered 2022-06-11: 3 via EPIDURAL

## 2022-06-10 MED ORDER — EPINEPHrine HCl (PF) 1 mg/mL (1 mL) injection Soln
1 | INTRAMUSCULAR | Status: AC
Start: 2022-06-10 — End: ?

## 2022-06-10 MED ORDER — ROPivacaine (PF) (NAROPIN) 2 mg/mL (0.2 %) epidural
2 | INTRAMUSCULAR | Status: AC
Start: 2022-06-10 — End: ?

## 2022-06-10 MED ORDER — lactated ringers IV bolus 1,000 mL
Freq: Once | INTRAVENOUS | Status: AC
Start: 2022-06-10 — End: 2022-06-10
  Administered 2022-06-10: 06:00:00 1000 mL via INTRAVENOUS

## 2022-06-10 MED ORDER — sodium chloride 0.9%
INTRAMUSCULAR | Status: AC
Start: 2022-06-10 — End: ?

## 2022-06-10 MED ORDER — sodium bicarbonate (NEUT) 4.2 % injection Soln
4.2 | INTRAVENOUS | Status: AC
Start: 2022-06-10 — End: ?

## 2022-06-10 MED ORDER — phenylephrine (NEO-SYNEPHRINE) injection
10 | INTRAMUSCULAR | PRN
Start: 2022-06-10 — End: 2022-06-11
  Administered 2022-06-10: 06:00:00 100 via INTRAVENOUS

## 2022-06-10 MED FILL — PHENYLEPHRINE 100 MCG/ML IN NSS 0.9% 10 ML IV SYRINGE: 100 100 mcg/ml | INTRAVENOUS | Qty: 10

## 2022-06-10 MED FILL — OXYTOCIN IN LACTATED RINGERS 30 UNIT/500 ML INTRAVENOUS SOLUTION: 30 30 unit/500 mL | INTRAVENOUS | Qty: 500

## 2022-06-10 MED FILL — FENTANYL (PF) 50 MCG/ML INJECTION SOLUTION: 50 50 mcg/mL | INTRAMUSCULAR | Qty: 2

## 2022-06-10 MED FILL — MARCAINE (PF) 0.25 % (2.5 MG/ML) INJECTION SOLUTION: 0.25 0.25 % (2.5 mg/mL) | INTRAMUSCULAR | Qty: 30

## 2022-06-10 MED FILL — XYLOCAINE-MPF 20 MG/ML (2 %) INJECTION SOLUTION: 20 20 mg/mL (2 %) | INTRAMUSCULAR | Qty: 5

## 2022-06-10 MED FILL — ROPIVACAINE (PF) 2 MG/ML (0.2 %) INJECTION SOLUTION: 2 2 mg/mL (0.2 %) | INTRAMUSCULAR | Qty: 200

## 2022-06-10 MED FILL — ONDANSETRON HCL (PF) 4 MG/2 ML INJECTION SOLUTION: 4 4 mg/2 mL | INTRAMUSCULAR | Qty: 2

## 2022-06-10 MED FILL — EPINEPHRINE HCL (PF) 1 MG/ML (1 ML) INJECTION SOLUTION: 1 1 mg/mL (1 mL) | INTRAMUSCULAR | Qty: 1

## 2022-06-10 MED FILL — SODIUM BICARBONATE 4.2 % INTRAVENOUS SOLUTION: 4.2 4.2 % | INTRAVENOUS | Qty: 5

## 2022-06-10 MED FILL — NAROPIN (PF) 2 MG/ML (0.2 %) INJECTION SOLUTION: 2 2 mg/mL (0.2 %) | INTRAMUSCULAR | Qty: 200

## 2022-06-10 NOTE — Progress Notes (Signed)
Labor progress note    Electronic Fetal Monitoring:    Fetal Baseline: 150 bpm   Variability: moderate (6-25 beats per minute)   Acceleration: Present   Deceleration: Absent   Uterine Activity: contractions q2-7 m   Category I    Dilation: 3  Effacement (%): 20  Station: -2  Method: Manual  OB Examiner: MD Delories Mauri    IOL method:   Pitocin currently at 2    Will continue to uptitrate pitocin per protocol.    Rounding tab updated.      Emily Bruins, MD PGY-1  Obstetrics and Gynecology Resident

## 2022-06-10 NOTE — Progress Notes (Addendum)
Labor progress note    FHT: 150, moderate variability, accels present, no decels  Toco: q 5 minutes  MVUs: inadequate    Dilation: 7  Effacement (%): 80  Station: -1  Presentation: Vertex  Method: Manual  OB Examiner: Jaemarie Hochberg MD    Unchanged from prior; first called at 1830, would be for pCS at 0030 if remains unchanged with inadequate contractions  IUPC placed  Pit at [place]. Elvia Collum, MD  OB/GYN PGY-3

## 2022-06-10 NOTE — Progress Notes (Signed)
Labor progress note    Electronic Fetal Monitoring:    Fetal Baseline: 150 bpm   Variability: moderate (6-25 beats per minute)   Acceleration: Present   Deceleration: Absent   Uterine Activity: contractions q2-5 m   Category I    Dilation: 3  Effacement (%): 20  Station: -2  Method: Manual  OB Examiner: MD Wilkins Elpers    IOL method:   Pitocin currently at 2    Will continue to uptitrate per protocol and AROM on next check        Rounding tab updated.      Princess Bruins, MD PGY-1  Obstetrics and Gynecology Resident

## 2022-06-10 NOTE — Progress Notes (Signed)
Labor progress note  Pitocin Re-Start Note    FHT: 155, moderate variability, accels present, no decels  Toco: q 2-5 minutes    Dilation: 5  Effacement (%): 50  Station: -2  Presentation: Vertex  Method: Manual  OB Examiner: Dr Marlane Mingle    Unchanged cervical examination over 4 hours  For pitocin washout at this time  Given patient continues to decline IUPC so cannot increase pitocin past 73mu    Emily Haynes M. Elvia Collum, MD  OB/GYN PGY-3

## 2022-06-10 NOTE — Progress Notes (Signed)
Labor and Delivery Progress Note    Emily Haynes is a 32 y.o. [id number] at [redacted]w[redacted]d admitted for IOL 2/2 SI PreE without SF.      S: Patient doing well, denies complaints. Comfortable with epidural. Denies headaches, visual changes or right upper quadrant pain.     O:  Vitals:    06/10/22 0218 06/10/22 0230 06/10/22 0245 06/10/22 0330   BP: 121/67 121/67 112/61 114/56   Pulse: 122  111 133   Resp:   16    Temp:       TempSrc:       SpO2: 100% 100% 100% 100%       I/O this shift:  In: 1009.1 [P.O.:120; I.V.:889.1]  Out: 500 [Urine:500]  No intake/output data recorded.    Dilation: 3  Effacement (%): 20  Station: -2  Method: Manual  OB Examiner: MD Gmunder                 Done at 0500    Physical Exam:     Gen: NAD  Pulm: non-labored respirations  Card: well perfused  Abd: soft, non-tender  Ext: SCDs in place    Electronic Fetal Monitoring:               Fetal Baseline: 150 bpm              Variability: moderate (6-25 beats per minute)              Acceleration: Present              Deceleration: Absent              Uterine Activity:  2-3 uterine contractions/10 minutes    A/P: Emily Haynes is a 32 y.o. [id number] at [redacted]w[redacted]d admitted for IOL 2/2 SI PreE without SF.      IOL  - Patient with painful contractions for 1 day  - SVE 1/0/-4 in OB ED  - started with low dose pitocin as declined foley  - consented for CS prn  - Last SVE 3/20/-2 @ 0500  - for AROM after rounds  - pit is at 2  - comfortable with epidural  - Consented for C/S PRN    SI PreE without SF  - Per chart review, has reported elevated Bps in prior pregnancies and was started on bASA at 12 weeks with c/f cHTN  - Clinically and hemodynamically stable, mild range BP in clinic and in [facility] ED today  - HELLP labs on arrival with P/C 0.61, otherwise normal other than known gest thrombocytopenia  - BP normal over night  - Asx  - Given SI PreE without SF, recommended delivery     gDM  - Patient with an elevated 1 hour GCT >200 of 251, ruling in for gestational  diabetes  - She declined to see DAPP, meds, or patterning.  - has been counseled in clinic on risk fo DKA, and of stillbirth with diabetes or uncontrolled BG   - Given gDM, hx shoulder dystocia, and EFW today of 4300g on [place], so possibly up to 4700g with error, we recommended pCS. Patient declined       Gestational Thrombocytopenia  1/12: 127  3/1: 124     Hx Shoulder Dystocia  - in G6 pregnancy after a precipitous delivery, resolved with what sounds like McRoberts and suprapubic pressure. Baby was 9 lbs.  - Declined pCS that was recommended     GBS neg     H/o  PPROM and PTD   - In G2 preg at [redacted]w[redacted]d     Fetal status  - Cat 1  - EFW 4262g, AC measuring [redacted]w[redacted]d  (pelvis proven to 7262M)  - cephalic     PNC  - Rh pos  - Rubella immune  - BCM: undecided - pops vs depo    Dispo: L&D    Dr Macie Burows, PGY3  OBGYN

## 2022-06-10 NOTE — Nursing Note (Signed)
Assumed care of pt. Pt resting w epidural in place. Denies pain at this time. WCTM.

## 2022-06-10 NOTE — Progress Notes (Signed)
Blood sugar checked per order, 59. Pt was treated with juice. B/s repeated in 58mins, 64. Was repeated at 19mins per protocol 27. Pt refused to be treated, Dr Marlane Mingle notified. Pt cont to refuse IUPC and FSE.

## 2022-06-10 NOTE — Plan of Care (Signed)
Problem: Pain  Description: Labor and Delivery Pain  Goal: Patient will manage pain with the appropriate technique/intervention  Description: Assess and monitor patient's pain using appropriate pain scale. Collaborate with interdisciplinary team and initiate plan and interventions as ordered.  Re-assess patient's pain level 30-60 minutes after pain management intervention.  Outcome: Progressing  Goal: Patient's pain is progressing toward patient's stated pain goal  Description: Patient's pain is progressing toward patient's stated pain goal  Outcome: Progressing  Goal: Patient verbalizes a reduction in pain level  Outcome: Progressing  Goal: Patient vital signs are stable  Outcome: Progressing     Problem: Knowledge Deficit  Goal: Verbalizes understanding of labor plan  Description: Assess patient/family/caregiver's baseline knowledge level and ability to understand information.  Provide education via patient/family/caregiver's preferred learning method at appropriate level of understanding.   Outcome: Progressing

## 2022-06-10 NOTE — Progress Notes (Addendum)
Pit at 27mu/min.  Pt cont to decline IUPC and FSE

## 2022-06-10 NOTE — Progress Notes (Signed)
Labor progress note    FHT: 140, moderate variability, accels present, no decels  Toco: q 3 minutes  Cat I tracing    Dilation: 7  Effacement (%): 80  Station: -1  Presentation: Vertex  Method: Manual  OB Examiner: goel md    Emily Roux, MD PGY-1  Obstetrics and Gynecology

## 2022-06-10 NOTE — Progress Notes (Signed)
Labor Progress Note     To bedside for SVE    FHT: 130/mod/+accel/-decel  Toco: 3 ctx/10 min    SVE 5/60/-2 at 0830  S/p AROM at 0830 for copious clear fluid    Cat I    Pitocin at 12 milli-unit/min; up titrate per protocol.     Jaquelyn Bitter, MD, MPH  Ob/Gyn PGY-3

## 2022-06-10 NOTE — Nursing Note (Signed)
Pitocin stopped @1529  per MD orders. Pitocin restarted with a new bag at 3mu/min at 1558 per MD orders. Pt reports +FM. Pads changed pericare performed. Pt currently laying on left side using peanut ball. Pt denies further needs at this time. CLWR. WCTM.

## 2022-06-10 NOTE — Progress Notes (Signed)
Labor progress note    FHT: 145, moderate variability, accels present, no decels  Toco: q 5 minutes    Dilation: 7  Effacement (%): 80  Station: -1  Presentation: Vertex  Method: Manual  OB Examiner: Mulan Adan MD    Unchanged from prior  IUPC placed  Uncomfortable with epidural in place; just re-dosed by anesthesia  Pit at 64, can now uptitrate beyond 11mu    Emily Haynes M. Elvia Collum, MD  OB/GYN PGY-3

## 2022-06-10 NOTE — Progress Notes (Signed)
Assumed care of patient and discussed on evening rounds.      Briefly, 93 [id number] at 38'4 admitted for IOL for [facility], now with superimposed preeclampsia with severe features based on severe range BP.    -Labor: now in active labor, continue to titrate pitocin per protocol    -Preeclampsia with severe features: BP most recently mild range. Plt 124 on admission, but rest of HELLP labs wnl.  Urine output appropriate.    -GDM: blood sugars wnl    -Fetal well being reassuring with category I tracing    Pollie Friar, MD

## 2022-06-10 NOTE — Progress Notes (Signed)
Labor progress note  To bedside for increased pelvic pain    FHT: 135 bpm, moderate variability, accels present, no decels  Toco: q 2-5 minutes    Dilation: 5  Effacement (%): 80  Station: -2  Presentation: Vertex  Method: Manual  OB Examiner: Dr Marlane Mingle    560/-2  Unchanged from prior; pit at 18  Patient declining IUPC    Emily Haynes M. Elvia Collum, MD  OB/GYN PGY-3

## 2022-06-10 NOTE — Nursing Note (Signed)
Pt c/o icnreased pressure and urge to have BM. DR. Elvia Collum at [facility], SVE per MD 7/80/-1. Room set up for delivery.

## 2022-06-10 NOTE — Progress Notes (Signed)
Labor Progress Note     To bedside for SVE per patient request    FHT: 135/mod/+accel/-decel  Toco: q2-76m    SVE 5/50/-2 at 1445  S/p AROM at 0830    Cat I    Pitocin at 20 milli-unit/min. Discussed IUPC with patient, who has previously refused.    Jaquelyn Bitter, MD, MPH  Ob/Gyn PGY-3

## 2022-06-10 NOTE — Progress Notes (Signed)
Pt more comfortable after AROM. Tracing no accels, pt positioned Rt and left, bolus LR infusing. Pt comfortable with epidural.  Pitocin titrating per order.  Fob at bedside, will cont to monitor.

## 2022-06-10 NOTE — Anesthesia Post-Procedure Evaluation (Deleted)
Anesthesia Post Note    Patient: Emily Haynes    Procedure(s) Performed: * No procedures listed *    Anesthesia type: labor epidural    Patient location: Labor and Delivery    Airway: Patent    Post pain: Adequate analgesia    Nausea / Vomiting: Absent    Post-operative Hydration Status: Adequate    Post assessment: no apparent anesthetic complications    Last Vitals:   Vitals:    06/09/22 1807 06/09/22 1900 06/09/22 2100 06/09/22 2300   BP: 139/87 138/89 148/85 127/76   Pulse: 108 113 114 119   Resp:  18     Temp:  98 F (36.7 C)     TempSrc:  Oral     SpO2: 100% 100% 100% 100%        Last Temperature: 98 F (36.7 C) (06/09/2022  7:00 PM)      Post vital signs: stable    Level of consciousness: awake, alert , and oriented    Complications:  No notable events documented.

## 2022-06-10 NOTE — Nursing Note (Signed)
Pt care assumed. Pt currently 5/50/-2 (AROM @0830  this am). Pitocin currently going at 51mu/min (since 1154 this am). Blood sugar checked per order, 65. Will recheck in 2 hours, per order. Pt denies further needs at this time. CLWR. WCTM.

## 2022-06-11 ENCOUNTER — Encounter

## 2022-06-11 LAB — COMPREHENSIVE METABOLIC PANEL
ALT: 10 U/L (ref 7–52)
AST: 17 U/L (ref 13–39)
Albumin: 2.8 g/dL — ABNORMAL LOW (ref 3.5–5.7)
Alkaline Phosphatase: 75 U/L (ref 36–125)
Anion Gap: 12 mmol/L (ref 3–16)
BUN: 3 mg/dL — ABNORMAL LOW (ref 7–25)
CO2: 20 mmol/L — ABNORMAL LOW (ref 21–33)
Calcium: 8.3 mg/dL — ABNORMAL LOW (ref 8.6–10.3)
Chloride: 110 mmol/L (ref 98–110)
Creatinine: 0.56 mg/dL — ABNORMAL LOW (ref 0.60–1.30)
EGFR: >90
Glucose: 186 mg/dL — ABNORMAL HIGH (ref 70–100)
Osmolality, Calculated: 295 mOsm/kg (ref 278–305)
Potassium: 3.6 mmol/L (ref 3.5–5.3)
Sodium: 142 mmol/L (ref 133–146)
Total Bilirubin: 0.4 mg/dL (ref 0.0–1.5)
Total Protein: 5.1 g/dL — ABNORMAL LOW (ref 6.4–8.9)

## 2022-06-11 LAB — LACTATE DEHYDROGENASE: LD: 308 U/L — ABNORMAL HIGH (ref 110–270)

## 2022-06-11 LAB — PROTIME-INR
INR: 1.1 (ref 0.9–1.1)
INR: 1.1 (ref 0.9–1.1)
Protime: 14.8 seconds (ref 12.1–15.1)
Protime: 14.7 seconds (ref 12.1–15.1)

## 2022-06-11 LAB — APTT
aPTT: 31.4 seconds (ref 25.5–35.0)
aPTT: 29.9 seconds (ref 25.5–35.0)

## 2022-06-11 LAB — CBC
Hematocrit: 29.7 % — ABNORMAL LOW (ref 35.0–45.0)
Hematocrit: 28.1 % — ABNORMAL LOW (ref 35.0–45.0)
Hematocrit: 26.9 % — ABNORMAL LOW (ref 35.0–45.0)
Hemoglobin: 9.9 g/dL — ABNORMAL LOW (ref 11.7–15.5)
Hemoglobin: 9.5 g/dL — ABNORMAL LOW (ref 11.7–15.5)
Hemoglobin: 9.2 g/dL — ABNORMAL LOW (ref 11.7–15.5)
MCH: 25.5 pg — ABNORMAL LOW (ref 27.0–33.0)
MCH: 25.9 pg — ABNORMAL LOW (ref 27.0–33.0)
MCH: 26.2 pg — ABNORMAL LOW (ref 27.0–33.0)
MCHC: 33.5 g/dL (ref 32.0–36.0)
MCHC: 33.9 g/dL (ref 32.0–36.0)
MCHC: 34.4 g/dL (ref 32.0–36.0)
MCV: 76 fL — ABNORMAL LOW (ref 80.0–100.0)
MCV: 76.3 fL — ABNORMAL LOW (ref 80.0–100.0)
MCV: 76.3 fL — ABNORMAL LOW (ref 80.0–100.0)
MPV: 9.1 fL (ref 7.5–11.5)
MPV: 10 fL (ref 7.5–11.5)
MPV: 8.7 fL (ref 7.5–11.5)
Platelets: 110 10*3/uL — ABNORMAL LOW (ref 140–400)
Platelets: 125 10*3/uL — ABNORMAL LOW (ref 140–400)
Platelets: 102 10*3/uL — ABNORMAL LOW (ref 140–400)
RBC: 3.9 10*6/uL (ref 3.80–5.10)
RBC: 3.68 10*6/uL — ABNORMAL LOW (ref 3.80–5.10)
RBC: 3.52 10*6/uL — ABNORMAL LOW (ref 3.80–5.10)
RDW: 14.8 % (ref 11.0–15.0)
RDW: 15 % (ref 11.0–15.0)
RDW: 15.7 % — ABNORMAL HIGH (ref 11.0–15.0)
WBC: 9.2 10*3/uL (ref 3.8–10.8)
WBC: 8.6 10*3/uL (ref 3.8–10.8)
WBC: 9.1 10*3/uL (ref 3.8–10.8)

## 2022-06-11 LAB — FIBRINOGEN
Fibrinogen: 271 mg/dL (ref 218–406)
Fibrinogen: 247 mg/dL (ref 218–406)

## 2022-06-11 LAB — MASSIVE TRANSFUSION PROTOCOL

## 2022-06-11 LAB — URIC ACID: Uric Acid: 8 mg/dL (ref 3.8–8.7)

## 2022-06-11 LAB — POC GLU MONITORING DEVICE
POC Glucose Monitoring Device: 81 mg/dL (ref 70–100)
POC Glucose Monitoring Device: 74 mg/dL (ref 70–100)

## 2022-06-11 MED ORDER — PNV,calcium 72-iron-folic acid (PRENATAL PLUS) 27 mg iron- 1 mg tablet Tab
27 | ORAL | Status: AC
Start: 2022-06-11 — End: ?

## 2022-06-11 MED ORDER — miSOPROStoL (CYTOTEC) tablet 800 mcg
200 | Freq: Once | ORAL | Status: AC | PRN
Start: 2022-06-11 — End: 2022-06-11
  Administered 2022-06-11: 10:00:00 800 ug via ORAL

## 2022-06-11 MED ORDER — NIFEdipine (PROCARDIA) capsule 10 mg
10 | Freq: Once | ORAL | Status: AC
Start: 2022-06-11 — End: 2022-06-11
  Administered 2022-06-11: 15:00:00 10 mg via ORAL

## 2022-06-11 MED ORDER — ondansetron (ZOFRAN) injection
4 | INTRAMUSCULAR | PRN
Start: 2022-06-11 — End: 2022-06-11
  Administered 2022-06-11: 07:00:00 4 via INTRAVENOUS

## 2022-06-11 MED ORDER — cephALEXin (KEFLEX) 500 MG capsule
500 | ORAL | Status: AC
Start: 2022-06-11 — End: ?

## 2022-06-11 MED ORDER — loperamide (IMODIUM) capsule 2 mg
2 | Freq: Four times a day (QID) | ORAL | Status: AC | PRN
Start: 2022-06-11 — End: 2022-06-11

## 2022-06-11 MED ORDER — EPINEPHrine HCl (PF) 1 mg/mL (1 mL) injection Soln
1 | INTRAMUSCULAR | Status: AC
Start: 2022-06-11 — End: ?

## 2022-06-11 MED ORDER — oxytocin (PITOCIN) 30 units in lactated ringers 500 mL IV BOLUS
30 | INTRAVENOUS | PRN
Start: 2022-06-11 — End: 2022-06-13

## 2022-06-11 MED ORDER — ketamine (KETALAR) injection
10 | INTRAMUSCULAR | PRN
Start: 2022-06-11 — End: 2022-06-11
  Administered 2022-06-11: 07:00:00 10 via INTRAVENOUS
  Administered 2022-06-11: 08:00:00 20 via INTRAVENOUS
  Administered 2022-06-11: 07:00:00 10 via INTRAVENOUS

## 2022-06-11 MED ORDER — acetaminophen (TYLENOL) tablet 975 mg
325 | Freq: Three times a day (TID) | ORAL
Start: 2022-06-11 — End: 2022-06-13
  Administered 2022-06-11 – 2022-06-13 (×6): 975 mg via ORAL

## 2022-06-11 MED ORDER — docusate sodium (COLACE) capsule 100 mg
100 | Freq: Every day | ORAL
Start: 2022-06-11 — End: 2022-06-11

## 2022-06-11 MED ORDER — oxytocin in lactated ringers IV infusion
30 | INTRAVENOUS | PRN
Start: 2022-06-11 — End: 2022-06-11
  Administered 2022-06-11: 08:00:00 83 via INTRAVENOUS
  Administered 2022-06-11: 07:00:00 600 via INTRAVENOUS

## 2022-06-11 MED ORDER — oxytocin in lactated ringers 30 unit/500 mL IV infusion
30 | INTRAVENOUS | Status: AC
Start: 2022-06-11 — End: ?

## 2022-06-11 MED ORDER — acetaminophen (TYLENOL) 325 MG tablet
325 | ORAL | Status: AC
Start: 2022-06-11 — End: ?

## 2022-06-11 MED ORDER — acetaminophen (TYLENOL) tablet 975 mg
325 | Freq: Once | ORAL | Status: AC
Start: 2022-06-11 — End: 2022-06-11
  Administered 2022-06-11: 06:00:00 975 mg via ORAL

## 2022-06-11 MED ORDER — oxytocin (PITOCIN) 30 units in lactated ringers 500 mL infusion
30 | Freq: Once | INTRAVENOUS | Status: AC | PRN
Start: 2022-06-11 — End: 2022-06-11
  Administered 2022-06-11: 10:00:00 83.3 mL/h via INTRAVENOUS

## 2022-06-11 MED ORDER — tranexamic acid (CYCLOKAPRON) IV Push - for Post-Partum Hemorrhage 1,000 mg
1000 | Freq: Once | INTRAVENOUS | Status: AC
Start: 2022-06-11 — End: 2022-06-12

## 2022-06-11 MED ORDER — morphine (PF) 1 mg/mL injection
1 | INTRAMUSCULAR | Status: AC
Start: 2022-06-11 — End: ?

## 2022-06-11 MED ORDER — ketamine in NaCl, iso-osmotic (KETALAR) 20 mg/2 mL (10 mg/mL) syringe
20 | INTRAMUSCULAR | Status: AC
Start: 2022-06-11 — End: ?

## 2022-06-11 MED ORDER — oxyCODONE (ROXICODONE) immediate release tablet 5 mg
5 | ORAL | PRN
Start: 2022-06-11 — End: 2022-06-13
  Administered 2022-06-12: 06:00:00 5 mg via ORAL

## 2022-06-11 MED ORDER — methylergonovine (METHERGINE) injection 0.2 mg
0.2 | INTRAMUSCULAR | PRN
Start: 2022-06-11 — End: 2022-06-13

## 2022-06-11 MED ORDER — proMETHazine (PHENERGAN) injection 6.25 mg
25 | Freq: Four times a day (QID) | INTRAMUSCULAR | PRN
Start: 2022-06-11 — End: 2022-06-13

## 2022-06-11 MED ORDER — ibuprofen (MOTRIN) 800 MG tablet
800 | ORAL | Status: AC
Start: 2022-06-11 — End: ?

## 2022-06-11 MED ORDER — metroNIDAZOLE (FLAGYL) tablet 500 mg
500 | Freq: Three times a day (TID) | ORAL | Status: AC
Start: 2022-06-11 — End: 2022-06-13
  Administered 2022-06-11 – 2022-06-13 (×6): 500 mg via ORAL

## 2022-06-11 MED ORDER — ondansetron (ZOFRAN) 4 mg/2 mL injection
4 | INTRAMUSCULAR | Status: AC
Start: 2022-06-11 — End: ?

## 2022-06-11 MED ORDER — hydrOXYzine HCL (ATARAX) tablet 10 mg
10 | Freq: Once | ORAL | Status: AC | PRN
Start: 2022-06-11 — End: 2022-06-11
  Administered 2022-06-11: 10 mg via ORAL

## 2022-06-11 MED ORDER — morphine (PF) injection
1 | INTRAMUSCULAR | PRN
Start: 2022-06-11 — End: 2022-06-11
  Administered 2022-06-11: 07:00:00 2 via EPIDURAL

## 2022-06-11 MED ORDER — ondansetron (ZOFRAN) injection 4 mg
4 | Freq: Three times a day (TID) | INTRAMUSCULAR | PRN
Start: 2022-06-11 — End: 2022-06-13

## 2022-06-11 MED ORDER — ketorolac (TORADOL) injection 15 mg
15 | Freq: Three times a day (TID) | INTRAMUSCULAR
Start: 2022-06-11 — End: 2022-06-13

## 2022-06-11 MED ORDER — lactated Ringers IV infusion
INTRAVENOUS | PRN
Start: 2022-06-11 — End: 2022-06-13

## 2022-06-11 MED ORDER — cephALEXin (KEFLEX) capsule 500 mg
500 | Freq: Three times a day (TID) | ORAL | Status: AC
Start: 2022-06-11 — End: 2022-06-13
  Administered 2022-06-11 – 2022-06-13 (×6): 500 mg via ORAL

## 2022-06-11 MED ORDER — citric acid-sodium citrate (BICITRA) 500-334 mg/5 mL solution
500-334 | ORAL | Status: AC
Start: 2022-06-11 — End: ?

## 2022-06-11 MED ORDER — lidocaine (PF) 2% (20 mg/mL) Soln
20 | INTRAMUSCULAR | PRN
Start: 2022-06-11 — End: 2022-06-11
  Administered 2022-06-11 (×4): 5 via EPIDURAL

## 2022-06-11 MED ORDER — dexamethasone (DECADRON) 4 mg/mL injection
4 | INTRAMUSCULAR | Status: AC
Start: 2022-06-11 — End: ?

## 2022-06-11 MED ORDER — hydrOXYzine HCL (ATARAX) tablet 50 mg
25 | Freq: Every evening | ORAL | PRN
Start: 2022-06-11 — End: 2022-06-13

## 2022-06-11 MED ORDER — senna-docusate (SENNA-S) 8.6-50 mg per tablet 1 tablet
8.6-50 | Freq: Every evening | ORAL
Start: 2022-06-11 — End: 2022-06-13
  Administered 2022-06-12: 06:00:00 1 via ORAL

## 2022-06-11 MED ORDER — fentaNYL (SUBLIMAZE) injection
50 | INTRAMUSCULAR | PRN
Start: 2022-06-11 — End: 2022-06-11
  Administered 2022-06-11: 07:00:00 100 via INTRAVENOUS

## 2022-06-11 MED ORDER — azithromycin (ZITHROMAX) 500 mg in sodium chloride 0.9% 250 mL ADDaptor IVPB
Freq: Once | INTRAVENOUS | Status: AC
Start: 2022-06-11 — End: 2022-06-11
  Administered 2022-06-11: 07:00:00 500 mg via INTRAVENOUS

## 2022-06-11 MED ORDER — midazolam (PF) (VERSED) 1 mg/mL injection
1 | INTRAMUSCULAR | Status: AC
Start: 2022-06-11 — End: ?

## 2022-06-11 MED ORDER — carboprost (HEMABATE) injection 250 mcg
250 | INTRAMUSCULAR | PRN
Start: 2022-06-11 — End: 2022-06-13
  Administered 2022-06-11: 12:00:00 250 ug via INTRAMUSCULAR

## 2022-06-11 MED ORDER — ceFAZolin (ANCEF) 2 g in sodium chloride 0.9% 100 mL ADDaptor IVPB
2 | Freq: Once | INTRAMUSCULAR | Status: AC
Start: 2022-06-11 — End: 2022-06-11
  Administered 2022-06-11: 07:00:00 2 g via INTRAVENOUS

## 2022-06-11 MED ORDER — oxytocin (PITOCIN) injection 10 Units
10 | Freq: Once | INTRAMUSCULAR | Status: AC | PRN
Start: 2022-06-11 — End: 2022-06-11

## 2022-06-11 MED ORDER — acetaminophen (TYLENOL) suppository 975 mg
325 | Freq: Three times a day (TID) | RECTAL
Start: 2022-06-11 — End: 2022-06-13

## 2022-06-11 MED ORDER — dexamethasone (DECADRON) injection 4 mg
4 | Freq: Once | INTRAMUSCULAR | Status: AC | PRN
Start: 2022-06-11 — End: 2022-06-11

## 2022-06-11 MED ORDER — nalbuphine (NUBAIN) injection 5 mg
10 | INTRAMUSCULAR | PRN
Start: 2022-06-11 — End: 2022-06-13

## 2022-06-11 MED ORDER — metroNIDAZOLE (FLAGYL) 500 MG tablet
500 | ORAL | Status: AC
Start: 2022-06-11 — End: ?

## 2022-06-11 MED ORDER — ibuprofen (MOTRIN) tablet 800 mg
800 | Freq: Three times a day (TID) | ORAL
Start: 2022-06-11 — End: 2022-06-13
  Administered 2022-06-11 – 2022-06-13 (×5): 800 mg via ORAL

## 2022-06-11 MED ORDER — lactated ringers IV bolus
INTRAVENOUS | PRN
Start: 2022-06-11 — End: 2022-06-13

## 2022-06-11 MED ORDER — NIFEdipine (PROCARDIA) 10 MG capsule
10 | ORAL | Status: AC
Start: 2022-06-11 — End: ?

## 2022-06-11 MED ORDER — fentaNYL (SUBLIMAZE) injection 50 mcg
50 | INTRAMUSCULAR | Status: AC | PRN
Start: 2022-06-11 — End: 2022-06-11

## 2022-06-11 MED ORDER — tranexamic acid (CYCLOKAPRON) 1,000 mg/10 mL (100 mg/mL) injection
1000 | INTRAVENOUS | Status: AC
Start: 2022-06-11 — End: ?

## 2022-06-11 MED ORDER — prenatal vitamin w-calcium,iron,folic acid tablet
27 | Freq: Every day | ORAL
Start: 2022-06-11 — End: 2022-06-13
  Administered 2022-06-11 – 2022-06-13 (×3): 1 via ORAL

## 2022-06-11 MED ORDER — bisacodyL (DULCOLAX) suppository 10 mg
10 | Freq: Every day | RECTAL | PRN
Start: 2022-06-11 — End: 2022-06-13

## 2022-06-11 MED ORDER — oxyCODONE (ROXICODONE) immediate release tablet 10 mg
5 | ORAL | PRN
Start: 2022-06-11 — End: 2022-06-13
  Administered 2022-06-12 – 2022-06-13 (×4): 10 mg via ORAL

## 2022-06-11 MED ORDER — ceFAZolin (ANCEF) 2 g in sodium chloride 0.9% 100 mL ADDaptor IVPB
2 | Freq: Once | INTRAMUSCULAR | Status: AC
Start: 2022-06-11 — End: 2022-06-11
  Administered 2022-06-11: 14:00:00 2 g via INTRAVENOUS

## 2022-06-11 MED ORDER — fentaNYL (SUBLIMAZE) injection 25 mcg
50 | INTRAMUSCULAR | Status: AC | PRN
Start: 2022-06-11 — End: 2022-06-11
  Administered 2022-06-11: 12:00:00 25 ug via INTRAVENOUS

## 2022-06-11 MED ORDER — loratadine (CLARITIN) tablet 10 mg
10 | Freq: Every day | ORAL | Status: AC | PRN
Start: 2022-06-11 — End: 2022-06-12

## 2022-06-11 MED ORDER — fentaNYL (SUBLIMAZE) 50 mcg/mL injection
50 | INTRAMUSCULAR | Status: AC
Start: 2022-06-11 — End: ?

## 2022-06-11 MED ORDER — midazolam (PF) (VERSED) injection
1 | INTRAMUSCULAR | PRN
Start: 2022-06-11 — End: 2022-06-11
  Administered 2022-06-11 (×2): 1 via INTRAVENOUS

## 2022-06-11 MED ORDER — citric acid-sodium citrate (BICITRA) solution 30 mL
500-334 | Freq: Once | ORAL | Status: AC
Start: 2022-06-11 — End: 2022-06-11
  Administered 2022-06-11: 06:00:00 30 mL via ORAL

## 2022-06-11 MED ORDER — phenylephrine (NEO-SYNEPHRINE) injection
10 | INTRAMUSCULAR | PRN
Start: 2022-06-11 — End: 2022-06-11
  Administered 2022-06-11: 07:00:00 200 via SUBCUTANEOUS

## 2022-06-11 MED ORDER — oxytocin (PITOCIN) 30 units in lactated ringers 500 mL IV BOLUS
30 | Freq: Once | INTRAVENOUS | PRN
Start: 2022-06-11 — End: 2022-06-13

## 2022-06-11 MED ORDER — HYDROmorphone (DILAUDID) 1 mg/mL injection Syrg
1 | INTRAMUSCULAR | Status: AC
Start: 2022-06-11 — End: ?

## 2022-06-11 MED ORDER — famotidine (PF) (PEPCID) injection 20 mg
20 | Freq: Two times a day (BID) | INTRAVENOUS
Start: 2022-06-11 — End: 2022-06-11
  Administered 2022-06-11: 06:00:00 20 mg via INTRAVENOUS

## 2022-06-11 MED ORDER — HYDROmorphone (DILAUDID) injection Syrg 1 mg
1 | Freq: Once | INTRAMUSCULAR | Status: AC
Start: 2022-06-11 — End: 2022-06-11
  Administered 2022-06-11: 10:00:00 1 mg via INTRAVENOUS

## 2022-06-11 MED ORDER — simethicone (MYLICON) chewable tablet 80 mg
80 | Freq: Four times a day (QID) | ORAL | PRN
Start: 2022-06-11 — End: 2022-06-13
  Administered 2022-06-12: 10:00:00 80 mg via ORAL

## 2022-06-11 MED ORDER — fentaNYL (SUBLIMAZE) injection 12.5 mcg
50 | INTRAMUSCULAR | Status: AC | PRN
Start: 2022-06-11 — End: 2022-06-11

## 2022-06-11 MED ORDER — magnesium hydroxide (MILK OF MAGNESIA) 400 mg/5 mL oral suspension 30 mL
400 | Freq: Four times a day (QID) | ORAL | PRN
Start: 2022-06-11 — End: 2022-06-13

## 2022-06-11 MED ORDER — dexamethasone (DECADRON) injection
4 | INTRAMUSCULAR | PRN
Start: 2022-06-11 — End: 2022-06-11
  Administered 2022-06-11: 07:00:00 8 via INTRAVENOUS

## 2022-06-11 MED ORDER — ketorolac (TORADOL) 15 mg/mL injection
15 | INTRAMUSCULAR | Status: AC
Start: 2022-06-11 — End: ?

## 2022-06-11 MED ORDER — electrolyte-R (pH 7.4) (NORMOSOL-R pH 7.4) IV solution
INTRAVENOUS | PRN
Start: 2022-06-11 — End: 2022-06-11
  Administered 2022-06-11 (×2): via INTRAVENOUS

## 2022-06-11 MED ORDER — phenylephrine (NEO-SYNEPHRINE) 10 mg in sodium chloride 0.9 % 100 mL infusion
10 | INTRAMUSCULAR | PRN
Start: 2022-06-11 — End: 2022-06-11
  Administered 2022-06-11: 07:00:00 10 via INTRAVENOUS

## 2022-06-11 MED ORDER — azithromycin (ZITHROMAX) 500 mg injection
500 | INTRAVENOUS | Status: AC
Start: 2022-06-11 — End: ?

## 2022-06-11 MED ORDER — sodium chloride 0.9% PgBk
INTRAVENOUS | Status: AC
Start: 2022-06-11 — End: ?

## 2022-06-11 MED ORDER — morphine (PF) injection
1 | INTRAMUSCULAR | PRN
Start: 2022-06-11 — End: 2022-06-11
  Administered 2022-06-11: 08:00:00 4 via INTRAVENOUS
  Administered 2022-06-11 (×2): 2 via INTRAVENOUS

## 2022-06-11 MED ORDER — tranexamic acid (CYCLOKAPRON) IV Push - for Post-Partum Hemorrhage 1,000 mg
1000 | INTRAVENOUS | PRN
Start: 2022-06-11 — End: 2022-06-13

## 2022-06-11 MED ORDER — lactated Ringers IV infusion
INTRAVENOUS
Start: 2022-06-11 — End: 2022-06-13
  Administered 2022-06-11: 10:00:00 125 mL/h via INTRAVENOUS

## 2022-06-11 MED ORDER — ceFAZolin (ANCEF) 2 gram injection
2 | INTRAMUSCULAR | Status: AC
Start: 2022-06-11 — End: ?

## 2022-06-11 MED ORDER — fentaNYL (SUBLIMAZE) injection 25 mcg
50 | Freq: Once | INTRAMUSCULAR | Status: AC
Start: 2022-06-11 — End: 2022-06-12

## 2022-06-11 MED ORDER — famotidine (PF) (PEPCID) 20 mg/2 mL injection
20 | INTRAVENOUS | Status: AC
Start: 2022-06-11 — End: ?

## 2022-06-11 MED ORDER — ceFAZolin (ANCEF) 2 g in sodium chloride 0.9% 100 mL ADDaptor IVPB
Freq: Three times a day (TID) | INTRAVENOUS
Start: 2022-06-11 — End: 2022-06-11

## 2022-06-11 MED ORDER — diphenhydrAMINE (BENADRYL) injection 25 mg
50 | Freq: Four times a day (QID) | INTRAMUSCULAR | Status: AC | PRN
Start: 2022-06-11 — End: 2022-06-12

## 2022-06-11 MED ORDER — peppermint oiL liquid 1 mL
Freq: Once | Status: AC | PRN
Start: 2022-06-11 — End: 2022-06-11

## 2022-06-11 MED FILL — LOPERAMIDE 2 MG CAPSULE: 2 2 mg | ORAL | Qty: 1

## 2022-06-11 MED FILL — PRENATAL PLUS (CALCIUM CARBONATE) 27 MG IRON-1 MG TABLET: 27 27 mg iron- 1 mg | ORAL | Qty: 1

## 2022-06-11 MED FILL — OXYTOCIN IN LACTATED RINGERS 30 UNIT/500 ML INTRAVENOUS SOLUTION: 30 30 unit/500 mL | INTRAVENOUS | Qty: 500

## 2022-06-11 MED FILL — HYDROMORPHONE 1 MG/ML INJECTION SYRINGE: 1 1 mg/mL | INTRAMUSCULAR | Qty: 1

## 2022-06-11 MED FILL — CEPHALEXIN 500 MG CAPSULE: 500 500 MG | ORAL | Qty: 1

## 2022-06-11 MED FILL — METRONIDAZOLE 500 MG TABLET: 500 500 MG | ORAL | Qty: 1

## 2022-06-11 MED FILL — KETAMINE 20 MG/2 ML (10 MG/ML) IN SODIUM CHLOR,ISO-OSMOTIC INJ SYRINGE: 20 20 mg/2 mL (10 mg/mL) | INTRAMUSCULAR | Qty: 2

## 2022-06-11 MED FILL — CEFAZOLIN 2 GRAM SOLUTION FOR INJECTION: 2 2 gram | INTRAMUSCULAR | Qty: 1

## 2022-06-11 MED FILL — PHENYLEPHRINE 10 MG/ML INJECTION SOLUTION: 10 10 mg/mL | INTRAMUSCULAR | Qty: 1

## 2022-06-11 MED FILL — FAMOTIDINE (PF) 20 MG/2 ML INTRAVENOUS SOLUTION: 20 20 mg/2 mL | INTRAVENOUS | Qty: 2

## 2022-06-11 MED FILL — TYLENOL 325 MG TABLET: 325 325 mg | ORAL | Qty: 3

## 2022-06-11 MED FILL — TYLENOL 325 MG TABLET: 325 325 mg | ORAL | Qty: 1

## 2022-06-11 MED FILL — SODIUM CHLORIDE 0.9 % INJECTION SOLUTION: INTRAMUSCULAR | Qty: 60

## 2022-06-11 MED FILL — FENTANYL (PF) 50 MCG/ML INJECTION SOLUTION: 50 50 mcg/mL | INTRAMUSCULAR | Qty: 2

## 2022-06-11 MED FILL — KETOROLAC 15 MG/ML INJECTION SOLUTION: 15 15 mg/mL | INTRAMUSCULAR | Qty: 1

## 2022-06-11 MED FILL — NIFEDIPINE 10 MG CAPSULE: 10 10 MG | ORAL | Qty: 1

## 2022-06-11 MED FILL — DURAMORPH (PF) 1 MG/ML INJECTION SOLUTION: 1 1 mg/mL | INTRAMUSCULAR | Qty: 10

## 2022-06-11 MED FILL — HYDROXYZINE HCL 10 MG TABLET: 10 10 MG | ORAL | Qty: 1

## 2022-06-11 MED FILL — ONDANSETRON HCL (PF) 4 MG/2 ML INJECTION SOLUTION: 4 4 mg/2 mL | INTRAMUSCULAR | Qty: 2

## 2022-06-11 MED FILL — DEXAMETHASONE SODIUM PHOSPHATE 4 MG/ML INJECTION SOLUTION: 4 4 mg/mL | INTRAMUSCULAR | Qty: 1

## 2022-06-11 MED FILL — SODIUM CITRATE-CITRIC ACID 500 MG-334 MG/5 ML ORAL SOLUTION: 500-334 500-334 mg/5 mL | ORAL | Qty: 30

## 2022-06-11 MED FILL — MIDAZOLAM (PF) 1 MG/ML INJECTION SOLUTION: 1 1 mg/mL | INTRAMUSCULAR | Qty: 2

## 2022-06-11 MED FILL — SODIUM CHLORIDE 0.9 % INTRAVENOUS PIGGYBACK: INTRAVENOUS | Qty: 100

## 2022-06-11 MED FILL — IBUPROFEN 800 MG TABLET: 800 800 MG | ORAL | Qty: 1

## 2022-06-11 MED FILL — CYKLOKAPRON 1,000 MG/10 ML (100 MG/ML) INTRAVENOUS SOLUTION: 1000 1,000 mg/10 mL (100 mg/mL) | INTRAVENOUS | Qty: 10

## 2022-06-11 MED FILL — EPINEPHRINE HCL (PF) 1 MG/ML (1 ML) INJECTION SOLUTION: 1 1 mg/mL (1 mL) | INTRAMUSCULAR | Qty: 1

## 2022-06-11 MED FILL — AZITHROMYCIN 500 MG INTRAVENOUS SOLUTION: 500 500 mg | INTRAVENOUS | Qty: 500

## 2022-06-11 NOTE — Progress Notes (Cosign Needed)
Decision for Cesarean Section     Category:  Unplanned  Decision time: 06/11/22 at 0030    Indication: This is a 31 y.o.y.o. [id number] at [redacted]w[redacted]d weeks who is to undergo Cesarean section for an indication of Arrest of dilation or descent. Patient remained 7/80/-1 with inadequate contractions for >6hrs despite pitocin of 30mu. Likely CPD given suspicion for fetal macrosomia in the setting of poorly controlled GDM.     Diagnoses: SI PreE w/o SF, GDM, h/o shoulder dystocia, gestational thrombocytopenia    Contraception plans:   Nexplanon    Planned Procedures:  Cesarean delivery    Planned anesthesia:  Epidural     Pre-Op H/H:   Lab Results   Component Value Date    WBC 6.7 06/09/2022    HGB 12.6 06/09/2022    HCT 37.7 06/09/2022    MCV 75.7 (L) 06/09/2022    PLT 124 (L) 06/09/2022       Placental Location: anterior  Pre-Op Antibiotics: ancef and azithromycin  Consent: Signed and in the chart    Plan: To OR for planned procedure, all questions/concerns addressed with patient and family.     Discussed with Dr. Iona Hansen, attending physician.    Emily Haynes

## 2022-06-11 NOTE — Progress Notes (Signed)
Called to bedside to evaluate for bleeding.    Patient had bleeding noted on chuck in recovery room. Pt tender on bimanual exam. She was given 1mg  dilaudid IV push. Hemorrhage kit, ultrasound brought to bedside. Exam revealed atonic lower uterine segment with clot in the LUS. 0.25mg  hemabate IM given. Pitocin bolused. Bimanual exam continued with clot swept from LUS. 825mcg buccal misoprostol given. Running QBL was 440cc. LUS and fundus became firm after 5 min of bimanual massage. Fundus remained firm. Pt hemodynamically stable.     Dr. Hiram Comber at [facility], MD PGY-2  OB-GYN Resident

## 2022-06-11 NOTE — Nursing Note (Signed)
First unit FFP up and infusing per RTP, Unit # [id number] Exp-06/14/2022.  Verified with Shela Leff RN.

## 2022-06-11 NOTE — Plan of Care (Signed)
Pt admitted to 3362 from L&D.  RN oriented pt to room and unit.  RN reviewed admission folder with pt and answered any questions pt had at this time. Pt signed admission paperwork and RN placed in pt chart.  Pt's VSS and assessment is WNL. Fundus is firm, midline and U1.  Lochia is scant, rubra and no clots at this time. Incision is clean dry and intact. Foley is draining clear yellow urine. Pt has no c/o pain at this time.  Otherwise, pt's needs are met at this time. CLWR.     Problem: Knowledge Deficit  Goal: Verbalizes understanding of labor plan  Description: Assess patient/family/caregiver's baseline knowledge level and ability to understand information.  Provide education via patient/family/caregiver's preferred learning method at appropriate level of understanding.   Outcome: Progressing     Problem: Pain  Description: Labor and Delivery Pain  Goal: Patient will manage pain with the appropriate technique/intervention  Description: Assess and monitor patient's pain using appropriate pain scale. Collaborate with interdisciplinary team and initiate plan and interventions as ordered.  Re-assess patient's pain level 30-60 minutes after pain management intervention.  Outcome: Progressing  Goal: Patient's pain is progressing toward patient's stated pain goal  Description: Patient's pain is progressing toward patient's stated pain goal  Outcome: Progressing  Goal: Patient verbalizes a reduction in pain level  Outcome: Progressing  Goal: Patient vital signs are stable  Outcome: Progressing     Problem: Safety  Goal: Patient will be injury free during hospitalization  Description: Assess and monitor vitals signs, neurological status including level of consciousness and orientation. Assess patient's risk for falls and implement fall prevention plan of care and interventions per hospital policy.      Ensure arm band on, uncluttered walking paths in room, adequate room lighting, call light and overbed table within reach,  bed in low position, wheels locked, side rails up per policy, and non-skid footwear provided.   Outcome: Progressing  Goal: Patient with weight > 350lbs will have appropriate equipment  Description: Consider ordering Bariatric Bed, Chair and Bedside Commode for patient weight > 350 lbs.  Outcome: Progressing     Problem: Patient will remain free of falls  Goal: Universal Fall Precautions  Outcome: Progressing     Problem: Daily Care  Goal: Daily care needs are met  Description: Assess and monitor ability to perform self care and identify potential discharge needs.  Outcome: Progressing     Problem: Psychosocial Needs  Goal: Demonstrates ability to cope with hospitalization/illness  Description: Assess and monitor patients ability to cope with his/her illness.  Outcome: Progressing  Goal: Collaborate with patient/family to identify patient's goals  Outcome: Progressing     Problem: Discharge Barriers  Goal: Patient's discharge needs are met  Description: Collaborate with interdisciplinary team and initiate plans and interventions as needed.   Outcome: Progressing     Problem: Acute Pain  Description: Patient's pain progressing toward patient's stated pain goal  Goal: Patient displays improved well-being such as baseline levels for pulse, BP, respirations and relaxed muscle tone or body posture  Outcome: Progressing     Problem: Knowledge Deficit  Goal: Patient/family/caregiver demonstrates understanding of disease process, treatment plan, medications, and discharge instructions  Description: Complete learning assessment and assess knowledge base.  Outcome: Progressing     Problem: C-Section Postpartum Care  Goal: Patient vital signs are stable  Outcome: Progressing  Goal: Dressing intact until removed with any drainage marked  Outcome: Progressing  Goal: Fundus firm at midline  Outcome:  Progressing  Goal: Moderate rubra without clots, no purulent discharge, no foul smelling lochia  Outcome: Progressing  Goal: Urine  output is 30 mL/hour or more  Description: Urinary catheter is draining yellow urine 30 mL/hour or more.  Outcome: Progressing  Goal: Patient is able to void/empty bladder after catheter is removed  Description: Assess bladder and bladder function.  Outcome: Progressing     Problem: Impaired physical mobility  Goal: Motor/sensory func for patient  Outcome: Progressing  Goal: Early mobilization is achieved  Outcome: Progressing  Goal: Ambulation with assistance  Outcome: Progressing  Goal: Ambulates independently  Outcome: Progressing     Problem: Pain  Goal: Patient's pain is progressing toward patient's stated pain goal  Description: Assess and monitor patient's pain using appropriate pain scale. Collaborate with interdisciplinary team and initiate plan and interventions as ordered. Re-assess patient's pain level 30 - 60 minutes after pain management intervention.   Outcome: Progressing     Problem: Breast Feeding  Goal: Breasts are soft with nipple integrity intact  Outcome: Progressing  Goal: Effective breast feeding established  Description: Mother is able to demonstrate proper infant positioning and alignment to promote adequate latching and breastfeeding.   Outcome: Progressing     Problem: Potential for hemorrhage  Goal: Bleeding minimized  Outcome: Progressing  Goal: Excessive bleeding will be minimized  Outcome: Progressing  Goal: Maintains normal hemostasis  Outcome: Progressing  Goal: Type and screen always current  Outcome: Progressing  Goal: Lab values WNL (HGB, HCT, Platelets)  Outcome: Progressing  Goal: Normal lochia flow  Outcome: Progressing

## 2022-06-11 NOTE — Nursing Note (Signed)
Blood pressure result after 20 minutes of taking Procardia is 131/78.  Call to Dr Maylene Roes to report.

## 2022-06-11 NOTE — Nursing Note (Signed)
RN witnessed [name] removal, peri-pad changed to monitor for new bleeding, food tray ordered per pt request. Pt denies needing anything else at this time

## 2022-06-11 NOTE — Op Note (Addendum)
Operative Report: Primary Low Transverse Cesarean Section    Patient Name: Emily Haynes  MRN: [id number]  Date of Surgery: 06/11/2022    Pre-op Diagnoses:   1. IUP at [redacted]w[redacted]d  2. SI PreE w/o SF  3. Gestational diabetes  4. fetal macrosomia  5. [place] multiparity  6. H/o shoulder dystocia  7. Gestational thrombocytopenia  8. Arrest of dialation    Post op Diagnoses: same    Procedure: primary low transverse cesarean section   Attending: Pollie Friar MD  Residents: Georjean Mode, MD    Anesthesia: epidural  Estimated Blood Loss: 800 cc  IV Fluids: 1500 cc of crystalloid  Urine Output: 235 cc of clear urine  Specimen: Placenta sent to pathology  Complications: none    Findings:   1. Liveborn female infant in vertex presentation, weight 5533g, Apgars 8/8  2. Intact placenta with 3 vessel cord   3. Normal appearing uterus, bilateral tubes, and bilateral ovaries    Indication and Consent  This is a 32 y.o. [id number] at [redacted]w[redacted]d by 11 who presented for IOL 2/2 SI PreE w/o SF.    The patient understood that the risks of cesarean section include, but are not limited to, visceral or vascular injury, infection, blood loss and need for transfusion, prolonged hospitalization and reoperation. The patient stated understanding and desired to proceed. All questions were answered.    Procedure:  The patient was taken to the operating room where epidural anesthesia was found to be adequate. Two grams of ancef and 500mg  azithromycin were given for infection prophylaxis. She was prepared and draped in the dorsal supine position with a leftward tilt. A timeout was performed and all were in agreement. A Sandria Manly skin incision was made with the scalpel. The incision was carried down to the fascia sharply with the scalpel.  The fascial incision was extended laterally bilaterally bluntly. Fascia dissected off muscle bluntly. The rectus muscle was separated in the midline down to the level of the pubic symphysis. Preperitoneal fatty tissue was bluntly  dissected to expose the peritoneum. The peritoneum was found to be free of adherent bowel and entered bluntly. The peritoneal incision was extended superiorly and inferiorly to the bladder reflection with good visualization of the bladder.    The bladder blade was inserted and vesicouterine peritoneum identified. Intraabdominal survey revealed scant, clear peritoneal fluid and the thinned out lower uterine segment. The bladder blade was repositioned to keep the bladder out of the operative field. The lower uterine segment was incised with a scalpel. The amniotic sac was ruptured, and clear fluid was noted. The uterine incision was extended bluntly with craniocaudal traction.    The fetus was in cephalic position. The head was elevated out of the pelvis with special attention paid to avoid using the uterine incision as a fulcrum. Gentle fundal pressure was applied once the head was brought into the incision. The infant was delivered by hooking underneath shoulders and delivering the shoulders one at a time.   The mouth and nose were suctioned with a bulb. The cord was clamped and cut. The infant was handed to the pediatrician. IV oxytocin was initiated to facilitate uterine contractions. The placenta was delivered intact with manual massage of the uterine fundus. The uterus was then exteriorized. The inside of the uterus was gently wiped with a lap sponge to assure complete placental membrane removal. The uterine incision was closed with 0 monocryl in a running unlocked fashion. Oozing of the hysterotomy was noted requiring a few  additional figure of eight sutures and electrocautery. Hemostasis was noted. The ovaries and tubes were found to be normal. The uterus, tubes and ovaries were then returned to the abdominal cavity. The blood clots and fluid were wiped out of the abdomen and and a suction irrigator was placed to remove blood clots. The uterine incision was reinspected and some ongoing oozing was noted,  surgicel powder was placed and hemostasis was noted.     The fascial layer was closed with 0 PDS suture. The subcutaneous layer was then irrigated with sterile fluid and suction. Layer inspected for hemostasis. 3-0 vicryl was used to reapproximate the subcutaneous tissues. The skin was closed with 4-0 vicryl in a subcuticular fashion. The patient tolerated the procedure well. All the counts were correct times two. The patient was taken to the recovery room in stable condition.    Dr. Hiram Comber was present for the procedure.    Cristy Hilts. Elvia Collum, MD  OB/GYN PGY-3      I was scrubbed and participated in the entire procedure, including opening through the closure of the fascia.  I remained immediately available after that time.  I have reviewed the operative report and agree with the dictation.    Pollie Friar, MD        I was scrubbed and participated in the entire procedure, including opening through the fascia.  I remained immediately available after that time.  I have reviewed the operative report and agree with the dictation.    Pollie Friar, MD

## 2022-06-11 NOTE — Nursing Note (Signed)
Pt transported back to  3nw per wheelchair.  Pt transferred with assistance to wheelchair.  Infant and belongings with her.  Report to [facility].

## 2022-06-11 NOTE — Progress Notes (Addendum)
Labor progress note    FHT: 145 bpm, moderate variability, accels present, no decels  Toco: q -5 minutes  MVUs: inadequate    Dilation: 7  Effacement (%): 80  Station: -1  Presentation: Vertex  Method: Manual  OB Examiner: Refael Fulop MD    Unchanged from prior; first called at 1830, would be for pCS at 0030 if remains unchanged with inadequate contractions  Patient aware of medical advice and wants a few minutes to discuss  Pit at 20, will stop if patient decides to move forward with pCS    Garrin Kirwan M. Elvia Collum, MD  OB/GYN PGY-3

## 2022-06-11 NOTE — Progress Notes (Signed)
To bedside to remove [name]. [name] removed without difficulty and fundus firm. Bleeding appropriate. Vitals wnl. Foley at bedside with ~500cc of urine. Suction cannister for [name] with 300cc and ~50cc in tubing. Will CTM. Patient PreE w/SF but refusing Mg. Candidate for transfer to PP in 1 hr.    Archer Asa, DO  PGY-3 OB/GYN Resident

## 2022-06-11 NOTE — Progress Notes (Signed)
To bedside to discuss Mg for seizure Ppx. Patient continuing to refuse Mg at this time. Reiterated risks and patient expressed understanding. BP normotensive at this time. If remains high will initiate long acting med. rHELLP labs wnl other than Plts in the setting of PPH. [name] off suction. Will remove in half hour if bleeding stable.     Archer Asa, DO  PGY-3 OB/GYN Resident

## 2022-06-11 NOTE — Nursing Note (Signed)
B/P reviewed with MD, new orders for nifedipine 10mg .  Nurse asked the md to discuss plan of care with pt at this time due to pt unsure of the plan of care.

## 2022-06-11 NOTE — Progress Notes (Signed)
06/11/22 0640   Clinical Encounter   Spiritual Care Consult Needed No   Visited With Patient unavailable   Interdisciplinary Interaction Yes (Comment)  (OB Stat team.)   Unit L&D/NICU/Mother Baby   Visit  Initial   Visit Type Trauma;Other (comment)  (OB Stat.)   Referral From Page   Follow-up Needed Do Not Visit   Follow Up Needed Comments Visit only if paged.   Care Provided Active Listening;Other (comment)  (Pastoral presence.)   Needs Addressed Anxiety   Total Time Spent (minutes) 10 minutes     OB Stat page.  Patient attended by [facility] Stat team.  Spiritual care proffered.  Per nurse, chaplain will be paged only if specifically needed/wanted/requested.  Chaplain:  Rev. Farris Has Gorden Stthomas, D.Min.

## 2022-06-11 NOTE — Plan of Care (Signed)
Pts VSS and charted, assessment complete see charting for further details. Call light w/i reach. Pt states all needs are met at this time.     Problem: Knowledge Deficit  Goal: Verbalizes understanding of labor plan  Description: Assess patient/family/caregiver's baseline knowledge level and ability to understand information.  Provide education via patient/family/caregiver's preferred learning method at appropriate level of understanding.   Outcome: Progressing     Problem: Pain  Description: Labor and Delivery Pain  Goal: Patient will manage pain with the appropriate technique/intervention  Description: Assess and monitor patient's pain using appropriate pain scale. Collaborate with interdisciplinary team and initiate plan and interventions as ordered.  Re-assess patient's pain level 30-60 minutes after pain management intervention.  Outcome: Progressing  Goal: Patient's pain is progressing toward patient's stated pain goal  Description: Patient's pain is progressing toward patient's stated pain goal  Outcome: Progressing  Goal: Patient verbalizes a reduction in pain level  Outcome: Progressing  Goal: Patient vital signs are stable  Outcome: Progressing     Problem: Safety  Goal: Patient will be injury free during hospitalization  Description: Assess and monitor vitals signs, neurological status including level of consciousness and orientation. Assess patient's risk for falls and implement fall prevention plan of care and interventions per hospital policy.      Ensure arm band on, uncluttered walking paths in room, adequate room lighting, call light and overbed table within reach, bed in low position, wheels locked, side rails up per policy, and non-skid footwear provided.   Outcome: Progressing  Goal: Patient with weight > 350lbs will have appropriate equipment  Description: Consider ordering Bariatric Bed, Chair and Bedside Commode for patient weight > 350 lbs.  Outcome: Progressing     Problem: Patient will  remain free of falls  Goal: Universal Fall Precautions  Outcome: Progressing     Problem: Daily Care  Goal: Daily care needs are met  Description: Assess and monitor ability to perform self care and identify potential discharge needs.  Outcome: Progressing     Problem: Psychosocial Needs  Goal: Demonstrates ability to cope with hospitalization/illness  Description: Assess and monitor patients ability to cope with his/her illness.  Outcome: Progressing  Goal: Collaborate with patient/family to identify patient's goals  Outcome: Progressing     Problem: Discharge Barriers  Goal: Patient's discharge needs are met  Description: Collaborate with interdisciplinary team and initiate plans and interventions as needed.   Outcome: Progressing     Problem: Acute Pain  Description: Patient's pain progressing toward patient's stated pain goal  Goal: Patient displays improved well-being such as baseline levels for pulse, BP, respirations and relaxed muscle tone or body posture  Outcome: Progressing     Problem: Knowledge Deficit  Goal: Patient/family/caregiver demonstrates understanding of disease process, treatment plan, medications, and discharge instructions  Description: Complete learning assessment and assess knowledge base.  Outcome: Progressing     Problem: C-Section Postpartum Care  Goal: Patient vital signs are stable  Outcome: Progressing  Goal: Dressing intact until removed with any drainage marked  Outcome: Progressing  Goal: Fundus firm at midline  Outcome: Progressing  Goal: Moderate rubra without clots, no purulent discharge, no foul smelling lochia  Outcome: Progressing  Goal: Urine output is 30 mL/hour or more  Description: Urinary catheter is draining yellow urine 30 mL/hour or more.  Outcome: Progressing  Goal: Patient is able to void/empty bladder after catheter is removed  Description: Assess bladder and bladder function.  Outcome: Progressing     Problem: Impaired physical  mobility  Goal: Motor/sensory func  for patient  Outcome: Progressing  Goal: Early mobilization is achieved  Outcome: Progressing  Goal: Ambulation with assistance  Outcome: Progressing  Goal: Ambulates independently  Outcome: Progressing     Problem: Pain  Goal: Patient's pain is progressing toward patient's stated pain goal  Description: Assess and monitor patient's pain using appropriate pain scale. Collaborate with interdisciplinary team and initiate plan and interventions as ordered. Re-assess patient's pain level 30 - 60 minutes after pain management intervention.   Outcome: Progressing     Problem: Breast Feeding  Goal: Breasts are soft with nipple integrity intact  Outcome: Progressing  Goal: Effective breast feeding established  Description: Mother is able to demonstrate proper infant positioning and alignment to promote adequate latching and breastfeeding.   Outcome: Progressing     Problem: Potential for hemorrhage  Goal: Bleeding minimized  Outcome: Progressing  Goal: Type and screen always current  Outcome: Progressing  Goal: Normal lochia flow  Outcome: Progressing

## 2022-06-11 NOTE — TOC Discharge Planning (AHS/AVS) (Signed)
Anesthesia Transfer of Care Note    Patient: Emily Haynes  Procedure(s) Performed: * No procedures listed *    Patient location: Labor and Delivery PACU    Anesthesia type: labor epidural    Airway Device on Arrival to PACU/ICU: Room Air    IV Access: Peripheral    Monitors Recommended to be Used During PACU/ICU: Standard Monitors    Outstanding Issues to Address: None    Level of Consciousness: awake    Post vital signs:    Vitals:    06/11/22 0309   BP: 108/66   Pulse: 115   Resp: 16   Temp: 97.8   SpO2: 41%       Complications:  There were no known notable events for this encounter.    Date 06/10/22 0700 - 06/11/22 0659 06/11/22 0700 - 06/12/22 0659   Shift 0700-1459 1500-2259 2300-0659 24 Hour Total 0700-1459 1500-2259 2300-0659 24 Hour Total   INTAKE   I.V.   1500 1500         Volume (mL) (electrolyte-R (pH 7.4) (NORMOSOL-R pH 7.4) IV solution)   1500 1500       IV Piggyback   200 200         Volume (mL) (azithromycin (ZITHROMAX) 500 mg in sodium chloride 0.9% 250 mL ADDaptor IVPB)   100 100         Volume (mL) (ceFAZolin (ANCEF) 2 g in sodium chloride 0.9% 100 mL ADDaptor IVPB)   100 100       Shift Total   1700 1700       OUTPUT   Urine  2400 235 2635         Urine  1200 235 1435         Output (mL) (IUC (Foley) Non-latex 14 Fr.)  1200  1200       Blood   1185 1185         Calculated QBL (ml)   1185 1185       Shift Total  2400 1420 3820       Weight (kg)

## 2022-06-11 NOTE — Nursing Note (Signed)
Rapid transfusion ended at this time.  Pt reports no adverse reaction to PRBC or FFP.  Pt alert and oriented. [name] attached to wall suction with 200 cc in canister.  Foley catheter patent and draining clear light yellow urine.  Husband at bedside.  Side rails up x 2.

## 2022-06-11 NOTE — Progress Notes (Signed)
Pt start bleeding upon arrival from  L&D.Third year  and charge was notified.OB stat was called  at [phone].Pt received fentanyl , Hemabate. Corey Skains was placed. Pt received one unit of Pt was transferred back to L&D.

## 2022-06-11 NOTE — Nursing Note (Signed)
HELLP labs drawn and sent per order. Pt continues asymptomatic from any transfusion reaction.  Pt states she is unsure if she would like to take the Magnesium at this time.

## 2022-06-11 NOTE — Nursing Note (Signed)
Second unit FFP infusing per RTP, Unit #[id number], exp-06/14/2022 @19 :57.  Verified with Shela Leff RN.

## 2022-06-11 NOTE — Progress Notes (Signed)
OB/GYN Postoperative Cesarean Section Progress Note    32 y.o. [id number] POD #0 s/p p LTCS    Subjective:   To bedside to assess for bleeding on the postpartum unit. Upon my arrival, patient bled through chucks pads. OB stat had been called. Hemabate IM x1 was given, fluids were started while waiting for MTP (2u pRBCs and 2u FPP) to be started. Coags sent. After administration of fentanyl IV, uterus was swept and [name] device was placed in the lower uterine segement on suction. Bps remains appropriate with mild tachycardia, patient remained clinically oriented and was not in pain at the end.    Objective:  Patient Vitals for the past 24 hrs:   BP Temp Temp src Pulse Resp SpO2   06/11/22 0817 (!) 145/98 -- -- 104 -- 100 %   06/11/22 0813 -- 98.2 F (36.8 C) Oral -- 16 --   06/11/22 0745 (!) 153/99 -- -- 105 -- 97 %   06/11/22 0744 -- 98.1 F (36.7 C) Oral 103 18 --   06/11/22 0647 (!) 142/119 -- -- 122 -- --   06/11/22 0643 (!) 153/102 -- -- 128 -- --   06/11/22 0640 (!) 152/96 -- -- 141 -- --   06/11/22 0638 (!) 121/106 -- -- 143 -- --   06/11/22 0635 -- -- -- 122 -- --   06/11/22 0632 (!) 150/96 -- -- 122 -- --   06/11/22 0505 (!) 153/97 -- -- 110 22 100 %   06/11/22 0435 (!) 145/91 -- -- 130 22 100 %   06/11/22 0405 137/88 -- -- 110 20 100 %   06/11/22 0350 128/90 -- -- 108 14 100 %   06/11/22 0335 112/73 -- -- 104 12 98 %   06/11/22 0320 114/74 -- -- 112 12 98 %   06/11/22 0315 103/71 -- -- 115 12 96 %   06/11/22 0310 106/67 -- -- 115 12 94 %   06/11/22 0305 108/66 97.8 F (36.6 C) Axillary 78 12 94 %   06/11/22 0013 158/90 -- -- 122 -- 100 %   06/10/22 2300 137/72 -- -- 113 -- 100 %   06/10/22 2233 -- -- -- 116 -- 100 %   06/10/22 2200 139/77 -- -- 125 -- 95 %   06/10/22 2130 131/82 -- -- 113 -- 100 %   06/10/22 2056 131/82 -- -- 113 -- 100 %   06/10/22 2000 144/88 -- -- 109 -- 99 %   06/10/22 1900 141/85 -- -- 112 -- 94 %   06/10/22 1849 141/85 98.3 F (36.8 C) Oral 107 -- 100 %   06/10/22 1830 -- -- -- 120 --  100 %   06/10/22 1800 127/76 -- -- 114 -- 98 %   06/10/22 1731 131/77 -- -- 107 -- 100 %   06/10/22 1700 131/77 -- -- 109 -- 100 %   06/10/22 1629 141/82 98.3 F (36.8 C) Oral 108 -- 100 %   06/10/22 1600 141/82 -- -- 108 -- 100 %   06/10/22 1500 140/82 -- -- 111 -- 92 %   06/10/22 1430 125/79 -- -- 110 -- 94 %   06/10/22 1400 147/86 -- -- 109 -- 100 %   06/10/22 1300 137/85 -- -- 102 -- 97 %   06/10/22 1200 -- -- -- 109 -- 100 %   06/10/22 1154 141/84 -- -- 108 -- 100 %   06/10/22 1130 141/83 -- -- 110 -- 100 %   06/10/22 1100 140/80 -- --  114 -- 93 %   06/10/22 1000 137/76 -- -- (!) 0 -- 100 %   06/10/22 0931 137/78 -- -- 109 -- 92 %   06/10/22 0900 133/76 -- -- 109 -- 98 %   06/10/22 0838 136/87 -- -- 114 -- 96 %         Intake/Output Summary (Last 24 hours) at 06/11/2022 7035  Last data filed at 06/11/2022 0093  Gross per 24 hour   Intake 1700 ml   Output 4845 ml   Net -3145 ml     Gen: NAD  CV: RR  Resp: non-labored breathing  Uterus: firm, appropriately tender to palpation at uterine fundus, which is below umbilicus  Abdomen: pressure dressing clean, dry, intact, [name] and foley in place  Ext: warm, well-perfused,SCDs in place, calves non-tender, no edema    Lab Results   Component Value Date    WBC 8.6 06/11/2022    HGB 9.5 (L) 06/11/2022    HCT 28.1 (L) 06/11/2022    MCV 76.3 (L) 06/11/2022    PLT 125 (L) 06/11/2022       Asssessment: Emily Haynes is a 32 y.o. [id number] POD #0 s/p p LTCS. Since [name] placement is progressing well, afebrile, and stable. Vital signs are notable for elevated Bps in the setting of pain and tachycardia in the setting of acute blood loss aemia and exam is benign.    Plan:  Postpartum/post-op  - not yet post-op milestones; foley in place  - continue routine postpartum care  - encourage ambulation  - encourage fluids    Postpartum Hemorrhage  Gestational Thrombocytopenia  - Antepartum Hgb 12.6. plt 124    QBL:  1185 + 440 + 550  P: 9.9 plt 110 (I) > 2u pRBCs + 2 FFP > 9.5, Plts 125 > 9.5  plt 125  -coags wnl aside from low normal fibrinogen 247  -received a total of TXA x2, miso 899mcg x1, hemabate x2  -[name] and foley in place    SI PreE without SF  - Per chart review, has reported elevated Bps in prior pregnancies and was started on bASA at 12 weeks with c/f cHTN  - Clinically and hemodynamically stable, mild range BP in clinic and in [facility] ED today  - HELLP labs on arrival with P/C 0.61, otherwise normal other than known gestational thrombocytopenia  - elevated pressures most recently in the setting of pain; but will monitor closely for development of severe features    GDM  -will be for pp patterning  -A1c 6.7 on admission    Female infant  - bottle feeding  - at bedside    Birth control  - desires Nexplanon    Prophylaxis  - SCDs  - IS    Disposition: back to L&D      Hobart Marte M. Elvia Collum, MD  OB/GYN PGY-3

## 2022-06-11 NOTE — Progress Notes (Addendum)
Severe Range BP     Notified by RN, patient with severe range blood pressure.  Signs and symptoms include None.    Initial severe range BP was 168/88. Repeat BP was 161/96.     Vitals:    06/11/22 0900 06/11/22 0916 06/11/22 0933 06/11/22 1027   BP: (!) 162/96  (!) 157/92 (!) 161/96   Pulse: 98 97 96    Resp: 16  16    Temp:  98.1 F (36.7 C) 98.1 F (36.7 C)    TempSrc:  Oral Oral    SpO2: 100%  100%         Treatment Plan:  Medications: PO IR Nifedipine     Planned Testing/Interventions:   HELLP labs ordered    Patient now meeting criteria for PreE w/SF. Discussed c/f progression to eclampsia and role of Mg for seizure Ppx. Patient amenable to getting acute tx for BP, however, wanting to think about Mg at this time. Discussed risks extensively. Patient would like to think about it.    Archer Asa, DO  PGY-3 OB/GYN Resident

## 2022-06-11 NOTE — Nursing Note (Signed)
Suction to [name] off at this time.  Pt continues to refuse Magnesium at this time. B/P 138/85.  No c/o at this time.

## 2022-06-11 NOTE — Nursing Note (Signed)
Second unit PRBC up and runnning.  Unit #[id number], exp 07/10/2022 @23 :51  Verified with Shela Leff RN.

## 2022-06-11 NOTE — Nursing Note (Signed)
Pt transferred from 3nw due to having a post partum hemorrhage, [name] in place and connected to wall suction at 80 mm hg.  Rapid transfusion in progress, 1st unit given to pt while she is was on 3NW.

## 2022-06-11 NOTE — Nursing Note (Signed)
Sterile fluid removed from cervical seal of Emily Haynes.  Pt tolerated well.  No c/o at this time.

## 2022-06-11 NOTE — Progress Notes (Signed)
Mag Check    S: Emily Haynes is a 32 y.o. O1H0865 POD#0 s/p pLTCS with pregnancy c/b PPH s/p [name], 2u pRBC + 2u FFP, SI Pre-e w/ SF admitted for postpartum management. She reports feeling well. Denies chest pain, SOB, RUQ pain. Denies any other complaints.     O:   Vitals:    06/11/22 1541 06/11/22 1639 06/11/22 1800 06/11/22 1928   BP: 133/82 120/81  126/81   BP Location:    Right upper arm   Patient Position:    Sitting   Pulse: 98 100  99   Resp: 16 18  17    Temp:  97.9 F (36.6 C)  98.4 F (36.9 C)   TempSrc:  Oral  Oral   SpO2: 93% 96%  97%   Weight:   203 lb (92.1 kg)    Height:   5' 4 (1.626 m)          Intake/Output Summary (Last 24 hours) at 06/11/2022 2306  Last data filed at 06/11/2022 1639  Gross per 24 hour   Intake 2571 ml   Output 7795 ml   Net -5224 ml       Gen: NAD  CV: RRR  Pulm: normal effort, CTAB  Abd: gravid, uterus firm below the umbilicus, appropriately tender to palpation, no rebound, no guarding   Ext: warm, no edema  Neuro: 2+ patellar DTRs, no clonus      Lab Results   Component Value Date    WBC 9.1 06/11/2022    HGB 9.2 (L) 06/11/2022    HCT 26.9 (L) 06/11/2022    MCV 76.3 (L) 06/11/2022    PLT 102 (L) 06/11/2022       Lab Results   Component Value Date    CREATININE 0.56 (L) 06/11/2022    BUN 3 (L) 06/11/2022    NA 142 06/11/2022    K 3.6 06/11/2022    CL 110 06/11/2022    CO2 20 (L) 06/11/2022       No results found for: MG    Lab Results   Component Value Date    ALT 10 06/11/2022    AST 17 06/11/2022    ALKPHOS 75 06/11/2022    BILITOT 0.4 06/11/2022       Lab Results   Component Value Date    LDH 308 (H) 06/11/2022       Lab Results   Component Value Date    URICACID 8.0 06/11/2022         Assessment  [name] is a 32 y.o. [id number] POD#0 s/p pLTCS with pregnancy c/b PPH s/p [name], 2u pRBC + 2u FFP, SI Pre-e w/ SF admitted for postpartum management preeclampsia    Plan:    Postpartum/post-op  - not yet post-op milestones; foley in place  - continue routine postpartum care  -  encourage ambulation  - encourage fluids     Postpartum Hemorrhage  Gestational Thrombocytopenia  - Antepartum Hgb 12.6. plt 124    QBL:  1185 + 440 + 550cc  P: 9.9 plt 110 (I) > 2u pRBCs + 2 FFP > 9.5, Plts 125 > 9.5 plt 125  -coags wnl aside from low normal fibrinogen 247  -received a total of TXA x2, miso 835mcg x1, hemabate x2  -s/p [name]. For foley removal in the am   -for post-transfusion CBC, coags     Superimposed Pre-Eclampsia with Severe Features   - Per chart review, has reported elevated Bps in prior pregnancies  and was started on bASA at 12 weeks with c/f cHTN  - HELLP labs on arrival with P/C 0.61, otherwise normal other than known gestational thrombocytopenia  - developed severe range BP meeting criteria for pre-e w/ SF. Pt counseled thoroughly on recs for mag for seizure ppx, pt declined and continues to decline   -BP normal to mild range, last non-sustained SR BP at 1027 3/3   -Exam benign  -no s/sx of worsening disease at this time      Gestational Diabetes   Concern for Type II Diabetes Mellitus   -will be for pp patterning  -A1c 6.7 on admission  -infant weighed 5533g on delivery   -for 6wk 2hr GTT      Female infant  - bottle feeding  - at bedside     Birth control  - desires Nexplanon     Prophylaxis  - SCDs  - IS    Dispo: inpatient L&D for Mag    Monia Sabal, M.D.  OBGYN PGY-4

## 2022-06-11 NOTE — Anesthesia Post-Procedure Evaluation (Signed)
Anesthesia Post Note    Patient: Emily Haynes    Procedure(s) Performed: * No procedures listed *    Anesthesia type: labor epidural    Patient location: Labor and Delivery PACU    Airway: Patent    Post pain: Adequate analgesia    Nausea / Vomiting: Absent    Post-operative Hydration Status: Ongoing resuscitation    Post assessment: no apparent anesthetic complications    Last Vitals:   Vitals:    06/11/22 0315 06/11/22 0320 06/11/22 0335 06/11/22 0350   BP: 103/71 114/74 112/73 128/90   Pulse: 115 112 104 108   Resp: 12 12 12 14    Temp:       TempSrc:       SpO2: 96% 98% 98% 100%        Last Temperature: 97.8 F (36.6 C) (06/11/2022  3:05 AM)      Post vital signs: stable    Level of consciousness: awake    Complications:  There were no known notable events for this encounter.

## 2022-06-12 LAB — APTT: aPTT: 33.6 seconds (ref 25.5–35.0)

## 2022-06-12 LAB — CBC
Hematocrit: 23.5 % — ABNORMAL LOW (ref 35.0–45.0)
Hemoglobin: 8 g/dL — ABNORMAL LOW (ref 11.7–15.5)
MCH: 26.1 pg — ABNORMAL LOW (ref 27.0–33.0)
MCHC: 34 g/dL (ref 32.0–36.0)
MCV: 76.8 fL — ABNORMAL LOW (ref 80.0–100.0)
MPV: 9.6 fL (ref 7.5–11.5)
Platelets: 111 10*3/uL — ABNORMAL LOW (ref 140–400)
RBC: 3.06 10*6/uL — ABNORMAL LOW (ref 3.80–5.10)
RDW: 15.6 % — ABNORMAL HIGH (ref 11.0–15.0)
WBC: 9.9 10*3/uL (ref 3.8–10.8)

## 2022-06-12 LAB — PROTIME-INR
INR: 1.1 (ref 0.9–1.1)
Protime: 14.5 seconds (ref 12.1–15.1)

## 2022-06-12 LAB — FIBRINOGEN: Fibrinogen: 347 mg/dL (ref 218–406)

## 2022-06-12 MED ORDER — lactated ringers IV bolus 1,000 mL
Freq: Once | INTRAVENOUS | Status: AC
Start: 2022-06-12 — End: 2022-06-12
  Administered 2022-06-12: 07:00:00 1000 mL via INTRAVENOUS

## 2022-06-12 MED ORDER — lidocaine (PF) (XYLOCAINE) 10 mg/mL (1 %) 5 mL
10 | Freq: Once | INTRAMUSCULAR
Start: 2022-06-12 — End: 2022-06-13

## 2022-06-12 MED ORDER — lactated ringers IV bolus 1,000 mL
Freq: Once | INTRAVENOUS | Status: AC
Start: 2022-06-12 — End: 2022-06-12
  Administered 2022-06-12: 10:00:00 1000 mL via INTRAVENOUS

## 2022-06-12 MED FILL — TYLENOL 325 MG TABLET: 325 325 mg | ORAL | Qty: 3

## 2022-06-12 MED FILL — OXYCODONE 5 MG TABLET: 5 5 MG | ORAL | Qty: 2

## 2022-06-12 MED FILL — SIMETHICONE 80 MG CHEWABLE TABLET: 80 80 MG | ORAL | Qty: 1

## 2022-06-12 MED FILL — CEPHALEXIN 500 MG CAPSULE: 500 500 MG | ORAL | Qty: 1

## 2022-06-12 MED FILL — METRONIDAZOLE 500 MG TABLET: 500 500 MG | ORAL | Qty: 1

## 2022-06-12 MED FILL — OXYCODONE 5 MG TABLET: 5 5 MG | ORAL | Qty: 1

## 2022-06-12 MED FILL — M-NATAL PLUS 27 MG IRON-1 MG TABLET: 27 27 mg iron- 1 mg | ORAL | Qty: 1

## 2022-06-12 MED FILL — IBUPROFEN 800 MG TABLET: 800 800 MG | ORAL | Qty: 1

## 2022-06-12 MED FILL — SENNOSIDES 8.6 MG-DOCUSATE SODIUM 50 MG TABLET: 8.6-50 8.6-50 mg | ORAL | Qty: 1

## 2022-06-12 NOTE — Progress Notes (Signed)
R1 Emily Haynes in to see patient's incision at 2240. Michela Pitcher that the site looks okay. Steri strips applied.

## 2022-06-12 NOTE — Plan of Care (Signed)
Assessment completed as charted, VSS. Fundus firm and midline, u/u. Lochia small, no odor. Low TV abd incision open to air, edges starting to separate on middle of incision, slightly to the left. Doctor notified. When the nurse came back to measure the separation, the patient refused and said the doctor needed to be there first. Pt reported 8/10 pain burning incision pain, ice applied and PRN oxycodone administered. Whiteboard updated, CLWR. All needs met. Pt instructed to call out with needs or changes. FOB at bedside.   Problem: Knowledge Deficit  Goal: Patient/family/caregiver demonstrates understanding of disease process, treatment plan, medications, and discharge instructions  Description: Complete learning assessment and assess knowledge base.  Outcome: Progressing     Problem: C-Section Postpartum Care  Goal: Patient vital signs are stable  Outcome: Progressing  Goal: Fundus firm at midline  Outcome: Progressing  Goal: Moderate rubra without clots, no purulent discharge, no foul smelling lochia  Outcome: Progressing  Goal: Patient is able to void/empty bladder after catheter is removed  Description: Assess bladder and bladder function.  Outcome: Progressing     Problem: Impaired physical mobility  Goal: Early mobilization is achieved  Outcome: Progressing  Goal: Ambulates independently  Outcome: Progressing     Problem: Pain  Goal: Patient's pain is progressing toward patient's stated pain goal  Description: Assess and monitor patient's pain using appropriate pain scale. Collaborate with interdisciplinary team and initiate plan and interventions as ordered. Re-assess patient's pain level 30 - 60 minutes after pain management intervention.   Outcome: Progressing

## 2022-06-12 NOTE — Progress Notes (Signed)
Nurse called to inform me that pt's UOP has been low for several hours. Of note, the patient had a PPH  of 2210cc s/p [name], 2u pRBC and 2u FFP with Hgb trend of 12.6 (pre-op) -> 8.2 (post-transfusion). Pt did not have mIVF running for several hours after hemorrhage with 1.5L of IVF administered at time of CS with no other fluid boluses documented per chart review. VS wnl. Exam benign, not concerning for ongoing bleeding. Hgb downtrend appropriate with total QBL of blood loss. Pt tolerating PO fluid intake well. However, likely underresuscitated with fluids. Pt received 1L fluid bolus an hour ago. Will place another fluid bolus. Pt strongly desires foley to be removed as she reports she is in pain and cannot move due to the foley. Given low concern for acute intraabdominal bleeding, will pull foley and she if pt can void within 4hrs. For strict Is and Os in the hat. Will continue to monitor closely.     Monia Sabal, M.D.  OBGYN PGY-4

## 2022-06-12 NOTE — Plan of Care (Signed)
Assessment completed. VSS. Fundus firm, midline, and at U/1. Lochia is rubra and scant. Surgical incision is low transverse and open to air. Mother is bonding well with the infant. Call light within reach. No other needs expressed at this time.       Problem: Knowledge Deficit  Goal: Patient/family/caregiver demonstrates understanding of disease process, treatment plan, medications, and discharge instructions  Description: Complete learning assessment and assess knowledge base.  Outcome: Progressing     Problem: C-Section Postpartum Care  Goal: Patient vital signs are stable  Outcome: Progressing  Goal: Fundus firm at midline  Outcome: Progressing  Goal: Moderate rubra without clots, no purulent discharge, no foul smelling lochia  Outcome: Progressing  Goal: Patient is able to void/empty bladder after catheter is removed  Description: Assess bladder and bladder function.  Outcome: Progressing     Problem: Impaired physical mobility  Goal: Ambulates independently  Outcome: Progressing

## 2022-06-12 NOTE — Lactation Note (Signed)
This note was copied from a baby's chart.  Mom[employer] confirmed coverage and I delivered a Zomee breast pump to mother's room. Mother states feeding is going well and has no further questions/concerns at this time. Lactation number on [facility] board.

## 2022-06-12 NOTE — Plan of Care (Signed)
Problem: Pain  Description: Labor and Delivery Pain  Goal: Patient's pain is progressing toward patient's stated pain goal  Description: Patient's pain is progressing toward patient's stated pain goal  Outcome: Progressing  Goal: Patient vital signs are stable  Outcome: Progressing   Assessment completed as charted, VSS. Fundus firm and midline, u/1. Lochia small, no odor. Low TV abd incision w pressure dressing in place. Pt reports 0/10 pain, see eMAR for intervention. Whiteboard updated, CLWR. All needs met. Pt instructed to call out with needs or changes.

## 2022-06-12 NOTE — Lactation Note (Signed)
This note was copied from a baby's chart.    LACTATION CONSULTATION      Name: Emily Haynes     MRN: [id number]         Date of birth: 06/11/2024  Time of Birth: 1:50 AM  Gestational age: Gestational Age: [redacted]w[redacted]d Birth Weight: 12 lb 3.2 oz (5533 g) Most Recent Weight: Weight: (!) 5357 g (11 lb 13 oz)  Weight Change from Birth: -3%       Maternal Assessment:     Maternal Data:  Information for the patient's mother:  Jinny, Sweetland [[id number]]   34 y.o.   Gravida/Para:   Information for the patient's mother:  Shequila, Neglia [[id number]]   O2H4765    Information for the patient's mother:  Senta, Kantor [[id number]]   [redacted]w[redacted]d   Prenatal Breastfeeding Education:  Pumped and provided breastmilk with previous infants.     Breastfeeding Goal: supplement with formula and pump only  long term    Breast Assessment:  Breasts are: Breasts not assessed this encounter     Maternal Toxicology:   Information for the patient's mother:  Tyah, Acord [[id number]]     Lab Results   Component Value Date    AMPHET500CUT Negative 06/09/2022    BARBSCRNUR Negative 06/09/2022    BUPRENOR5 Negative 06/09/2022    BENZOSCRNUR Negative 06/09/2022    THCSCRNUR Negative 06/09/2022    COCAINSCRNUR Negative 06/09/2022    OPIATESCRNUR Negative 06/09/2022    METHADSCRNUR Negative 06/09/2022    OXYCOD100CUT Negative 06/09/2022    TCA300CUT Negative 06/09/2022    FENT2NGMLCUT Negative 06/09/2022      Information for the patient's mother:  Katarzyna, Wolven [[id number]]   History reviewed. No pertinent past medical history.   Information for the patient's mother:  Allysa, [place] [[id number]]   Active Problems:    * No active hospital problems. *     Other significant maternal history:   Gestational Diabetes  and Chronic Hypertension     Delivery Information:  Born on 06/11/2024 at 1:50 AM    Delivery method: C-Section, Low Transverse [251]    Additional Information:  Forceps attempted? No [0]  Vacuum extractor attempted? No [3]  Breech:   Complications:      Infant Assessment:    DOL:1    Feeding: Feeding Type: Formula    Use of supplementation: Non- Medical due to Maternal request after education   Type of Supplementation: Similac Advanced   Method of supplementation: Bottle    I&O adequacy:  Urine output:  is established   Stool output:is established  Percent weight change from birthweight: -3%     Oral Assessment:  Tongue Tie Assessment Score: 8      Intervention during consultation:   Interventions performed: education    Latch & Positioning: MOB stated she will pump when she goes home and declined needing help while here. MOB stated she has a wearable pump at home but does need an electric pump. Discussed supply but MOB stated she will pump once she is home.     Manual Expression: not addressed at this time    Pump Arrangements: Emailed to: [employer]     Education: engorgement prevention, engorgement management, and when to call [place]    Handouts Provided: Yes   Hand expression and Engorgement    Resource information given/ discussed with patient:  Post discharge breastfeeding resources provided- Caring for yourself and your newborn, Breastfeeding crib card  Resources for post discharge follow up with outpatient lactation and  [place] (if appropriate)  [facility] lactation services for breastfeeding help after discharge  See Discharge Instructions for more community lactation support and information.     Plan:     1. Breastfeed on cue and at least 8 times/24hrs, unlimited timing. May not nurse this often in the first 24 hours. Wake baby and offer breastfeeding if 3 hours since beginning of last feeding. Place infant skin to skin if infant will not breastfeed at 3 hours.   2. Hand express prior to latch to evert nipple and entice infant to breast.   3. It is important to use gentle stimulation during the feeding to promote active eating. Offer both breasts at every feeding. Burp infant in between sides. Alternate which breast is used first.   4. Offer STS often while awake.  Mother holding infant skin to skin between feedings will promote milk supply and allow infant to rest more deeply.   5. Maintain a feeding log until infant is gaining weight and seen by primary care physician.  6. Request BF assistance from [facility] or RN as needed.    Feeding plan reviewed with: MOB    Mother's response: verbalized understanding of education and instruction

## 2022-06-12 NOTE — Progress Notes (Signed)
OB/GYN Postoperative Cesarean Section Progress Note    Subjective: Emily Haynes is a 32 y.o. [id number] POD#1 following a primary LTCS vis Sandria Manly incision 2/2 arrest of dilation.    The patient reports she is feeling well with no complaints. Patient is  ambulating and is tolerating PO. She is not yet passing flatus, nor had a bowel movement. Pain is well controlled on current regimen. Lochia about the same as a period.     Denies SOB, chest pain, headaches, changes in vision, RUQ pain, nausea, vomiting, dizziness, and light-headedness.     Objective:  Patient Vitals for the past 24 hrs:   BP Temp Temp src Pulse Resp SpO2 Height Weight   06/12/22 0318 134/89 98.6 F (37 C) Oral 96 17 100 % -- --   06/11/22 2349 124/87 98.4 F (36.9 C) Oral 96 16 100 % -- --   06/11/22 1928 126/81 98.4 F (36.9 C) Oral 99 17 97 % -- --   06/11/22 1800 -- -- -- -- -- -- 5' 4 (1.626 m) 203 lb (92.1 kg)   06/11/22 1639 120/81 97.9 F (36.6 C) Oral 100 18 96 % -- --   06/11/22 1541 133/82 -- -- 98 16 93 % -- --   06/11/22 1329 134/84 -- -- 97 16 100 % -- --   06/11/22 1200 138/84 -- -- 102 -- 100 % -- --   06/11/22 1143 138/85 -- -- 121 16 98 % -- --   06/11/22 1056 131/78 -- -- 137 16 99 % -- --   06/11/22 1027 (!) 161/96 -- -- -- -- -- -- --   06/11/22 0933 (!) 157/92 98.1 F (36.7 C) Oral 96 16 100 % -- --   06/11/22 0916 -- 98.1 F (36.7 C) Oral 97 -- -- -- --   06/11/22 0900 (!) 162/96 -- -- 98 16 100 % -- --   06/11/22 0859 (!) 154/94 98.3 F (36.8 C) Oral 98 18 100 % -- --   06/11/22 0845 (!) 160/104 -- -- 98 -- 100 % -- --   06/11/22 0830 (!) 166/105 -- -- 97 -- 100 % -- --   06/11/22 0828 (!) 163/98 98.2 F (36.8 C) Oral 100 16 100 % -- --   06/11/22 0817 (!) 145/98 -- -- 104 16 100 % -- --   06/11/22 0813 -- 98.2 F (36.8 C) Oral -- 16 -- -- --   06/11/22 0745 (!) 153/99 -- -- 105 -- 97 % -- --   06/11/22 0744 -- 98.1 F (36.7 C) Oral 103 18 -- -- --   06/11/22 0647 (!) 142/119 -- -- 122 -- -- -- --   06/11/22 0643  (!) 153/102 -- -- 128 -- -- -- --   06/11/22 0640 (!) 152/96 -- -- 141 -- -- -- --         Intake/Output Summary (Last 24 hours) at 06/12/2022 6967  Last data filed at 06/12/2022 0352  Gross per 24 hour   Intake 871 ml   Output 6085 ml   Net -5214 ml     UOP: 0.27 cc/hr over last 4 hours    Gen: NAD, A&Ox3  CV: Regular rate  Resp: Non-labored breaths  Abd: soft, non-distended, appropriately tender, fundus firm below umbilicus   Incision:   Ext: well perfused, no edema, SCDs in place      Lab Results   Component Value Date    WBC 9.9 06/12/2022    HGB 8.0 (L)  06/12/2022    HCT 23.5 (L) 06/12/2022    MCV 76.8 (L) 06/12/2022    PLT 111 (L) 06/12/2022        Lab Results   Component Value Date    RH Positive 06/09/2022       Asssessment: Emily Haynes is a 32 y.o. [id number] POD #1 s/p LTCS progressing well, afebrile, and stable. Vital signs are wnl  and exam is benign.    Plan:  POD#1   - Doing well postoperatively  - Continue current care.  - Encourage ambulation  - Encourage fluids  - Rh +  - Immunizations: Rubella immune  - Antepartum Hgb: 12.6, Postpartum Hgb: 8.0  - low UOP in setting of high QBL and underrescuitation  - Patient requested foley d/c early this AM, now voiding independently with adequate output.    SI PreE with SF  - Per chart review, has reported elevated Bps in prior pregnancies and was started on bASA at 12 weeks with c/f cHTN  - Clinically and hemodynamically stable, mild range BP in clinic and in [facility] ED today  - HELLP labs with P/C 0.61, otherwise normal other than known gest thrombocytopenia  - Met criteria for SI PreE w/ SF 2/2 SRBP 3/3 AM  - Patient counseled on risks and continues to decline Mg at this time       [place]  - initial EBL at time of pCS, 800cc  - Post operatively noted to have increased bleeding and found to have atonic LUS  - 0.25mg  hemabate IM, 826mcg buccal misoprostol given  - Bimanual with evacutation of 440cc clot  - now s/p [name] (350cc out)  - running QBL 1625cc  - received 2 u  RBC, 2u FFP with appropriate rise  -trend  A: 12.6, plt 124; QBL: 1185 + 440  P: 9.9 plt 110 (I) > 2u pRBCs + 2 FFP > 9.5, Plts 125 > 9.2, Plts 102 (during trans) -> 8.2       gDM  - Patient with an elevated 1 hours >200 of 251, ruling in for gestational diabetes  - She declined to see DAPP, meds, or patterning.  - has been counseled in clinic on risk fo DKA, and of stillbirth with diabetes or uncontrolled BG   - Given gDM, hx shoulder dystocia, and EFW today of 4300g on [place], so possibly up to 4700g with error, we recommended pCS. Patient declined.    Gestational Thrombocytopenia  1/12: 127  3/1: 124  3/4: 111  - see above for management    Female infant  - Bottle feeding    Birth control  - desires nexplanon    Prophylaxis  - SCDs  - IS  - Lovenox (Body mass index is 34.84 kg/m.)not indicated    SSI ppx  - ABX indicated (Body mass index is 34.84 kg/m. > 30 prepregnancy)      Disposition: inpatient    Minda Meo, PGY-1  Obstetrics & Gynecology

## 2022-06-13 MED ORDER — simethicone (MYLICON) 80 MG chewable tablet
80 | ORAL_TABLET | Freq: Four times a day (QID) | ORAL | 0 refills | Status: AC | PRN
Start: 2022-06-13 — End: ?
  Filled 2022-06-13: qty 30, 8d supply, fill #0

## 2022-06-13 MED ORDER — ibuprofen (MOTRIN) 800 MG tablet
800 | ORAL_TABLET | Freq: Three times a day (TID) | ORAL | 0 refills | Status: AC
Start: 2022-06-13 — End: ?
  Filled 2022-06-13: qty 30, 10d supply, fill #0

## 2022-06-13 MED ORDER — acetaminophen (TYLENOL) 325 MG tablet
325 | ORAL_TABLET | Freq: Three times a day (TID) | ORAL | 1 refills | 11.00000 days | Status: AC
Start: 2022-06-13 — End: ?
  Filled 2022-06-13: qty 100, 11d supply, fill #0

## 2022-06-13 MED ORDER — PNV,calcium 72-iron-folic acid (PRENATAL PLUS) 27 mg iron- 1 mg Tab tablet
27 | ORAL_TABLET | Freq: Every day | ORAL | 1 refills | Status: AC
Start: 2022-06-13 — End: ?
  Filled 2022-06-13: qty 30, 30d supply, fill #0

## 2022-06-13 MED ORDER — senna-docusate (SENNA-S) 8.6-50 mg per tablet
8.6-50 | ORAL_TABLET | Freq: Every evening | ORAL | 0 refills | Status: AC
Start: 2022-06-13 — End: ?
  Filled 2022-06-13: qty 30, 30d supply, fill #0

## 2022-06-13 MED ORDER — oxyCODONE (ROXICODONE) 5 MG immediate release tablet
5 | ORAL_TABLET | Freq: Four times a day (QID) | ORAL | 0 refills | 6.00000 days | Status: AC | PRN
Start: 2022-06-13 — End: 2022-06-18
  Filled 2022-06-13: qty 10, 3d supply, fill #0

## 2022-06-13 MED ORDER — polyethylene glycol (MIRALAX) 17 gram/dose powder
17 | Freq: Every day | ORAL | 0 refills | 14.00000 days | Status: AC
Start: 2022-06-13 — End: ?
  Filled 2022-06-13: qty 238, 14d supply, fill #0

## 2022-06-13 MED FILL — SENNOSIDES 8.6 MG-DOCUSATE SODIUM 50 MG TABLET: 8.6-50 8.6-50 mg | ORAL | Qty: 1

## 2022-06-13 MED FILL — CEPHALEXIN 500 MG CAPSULE: 500 500 MG | ORAL | Qty: 1

## 2022-06-13 MED FILL — METRONIDAZOLE 500 MG TABLET: 500 500 MG | ORAL | Qty: 1

## 2022-06-13 MED FILL — OXYCODONE 5 MG TABLET: 5 5 MG | ORAL | Qty: 2

## 2022-06-13 MED FILL — TYLENOL 325 MG TABLET: 325 325 mg | ORAL | Qty: 3

## 2022-06-13 MED FILL — M-NATAL PLUS 27 MG IRON-1 MG TABLET: 27 27 mg iron- 1 mg | ORAL | Qty: 1

## 2022-06-13 MED FILL — IBUPROFEN 800 MG TABLET: 800 800 MG | ORAL | Qty: 1

## 2022-06-13 NOTE — Nursing Note (Signed)
Pt left unit in stable condition via wheelchair, transport to main lobby with infant in car seat and all personal belongings.

## 2022-06-13 NOTE — Progress Notes (Signed)
OB/GYN Postoperative Cesarean Section Progress Note    Subjective: Emily Haynes is a 32 y.o. [id number] POD#2 following a primary LTCS vis Sandria Manly incision 2/2 arrest of dilation.    The patient reports she is feeling well with no complaints. Patient is  ambulating and is tolerating PO. She is passing flatus. Pain is well controlled on current regimen. Lochia about the same as a period.     Denies SOB, chest pain, headaches, changes in vision, RUQ pain, nausea, vomiting, dizziness, and light-headedness.     Objective:  Patient Vitals for the past 24 hrs:   BP Temp Temp src Pulse Resp SpO2   06/13/22 0314 (!) 135/96 97.7 F (36.5 C) Oral 103 18 97 %   06/12/22 2018 132/79 98.6 F (37 C) Oral 110 18 100 %   06/12/22 1543 125/82 98.2 F (36.8 C) Oral 108 18 99 %   06/12/22 0743 117/84 98.2 F (36.8 C) Oral 86 16 100 %         Intake/Output Summary (Last 24 hours) at 06/13/2022 0640  Last data filed at 06/12/2022 1400  Gross per 24 hour   Intake --   Output 1000 ml   Net -1000 ml     UOP: voiding independently     Gen: NAD, A&Ox3  CV: Regular rate  Resp: Non-labored breaths  Abd: soft, non-distended, appropriately tender, fundus firm below umbilicus   Incision:   Ext: well perfused, no edema, SCDs in place      Lab Results   Component Value Date    WBC 9.9 06/12/2022    HGB 8.0 (L) 06/12/2022    HCT 23.5 (L) 06/12/2022    MCV 76.8 (L) 06/12/2022    PLT 111 (L) 06/12/2022        Lab Results   Component Value Date    RH Positive 06/09/2022       Asssessment: Emily Haynes is a 32 y.o. [id number] POD #2 s/p LTCS progressing well, afebrile, and stable. Vital signs are wnl  and exam is benign.    Plan:  POD#2   - Doing well postoperatively  - Continue current care.  - Encourage ambulation  - Encourage fluids  - Rh +  - Immunizations: Rubella immune  - Antepartum Hgb: 12.6, Postpartum Hgb: 8.0  - low UOP in setting of high QBL and underrescuitation  - now voiding independently with adequate output.    SI PreE with SF  - Per  chart review, has reported elevated Bps in prior pregnancies and was started on bASA at 12 weeks with c/f cHTN  - HELLP labs with P/C 0.61, otherwise normal other than known gest thrombocytopenia  - Met criteria for SI PreE w/ SF 2/2 SRBP 3/3 AM  - Patient counseled on risks and continues to decline Mg at this time  - BP normal to low mild postpartum and pt asymptomatic      [facility]  - initial EBL at time of pCS, 800cc  - Post operatively noted to have increased bleeding and found to have atonic LUS  - 0.25mg  hemabate IM, 885mcg buccal misoprostol given  - Bimanual with evacutation of 440cc clot  - now s/p [name] (350cc out)  - running QBL 1625cc  - received 2 u RBC, 2u FFP with appropriate rise  -trend  A: 12.6, plt 124; QBL: 1185 + 440  P: 9.9 plt 110 (I) > 2u pRBCs + 2 FFP > 9.5, Plts 125 > 9.2, Plts 102 (during trans) ->  8.2       gDM  - Patient with an elevated 1 hours >200 of 251, ruling in for gestational diabetes  - She declined to see DAPP, meds, or patterning.  - has been counseled in clinic on risk fo DKA, and of stillbirth with diabetes or uncontrolled BG   - Given gDM, hx shoulder dystocia, and EFW today of 4300g on [place], so possibly up to 4700g with error, we recommended pCS. Patient declined.    Gestational Thrombocytopenia  1/12: 127  3/1: 124  3/4: 111  - see above for management    Female infant  - Bottle feeding    Birth control  - desires nexplanon at PPV    Prophylaxis  - SCDs  - IS  - Lovenox (Body mass index is 34.84 kg/m.)not indicated    SSI ppx  - ABX indicated (Body mass index is 34.84 kg/m. > 30 prepregnancy)      Disposition: discharge today    Danelle Earthly, MD PGY-1  Obstetrics and Gynecology

## 2022-06-13 NOTE — Discharge Summary (Signed)
Obstetrics Discharge Summary    Patient Name: Emily Haynes  [id number]  [id number]    Date of admission: 06/09/2022  Date of discharge:       Attending: Dr. Iran Planas    Diagnoses Present on Admission  1. IUP at [redacted]w[redacted]d    2. gDM  3. Gestational thrombocytopenia  4. SI PreE w/o SF    Diagnosis Present on Discharge  1. IUP at [redacted]w[redacted]d s/p primarylow transverse cesarean section  2. gDM  3. SI PreE w/ SF  4. [place]  5. Gestational Thrombocytopenia    Procedures performed  1. Primary low transverse cesarean section    Consultations  1. Anesthesia  2. Lactation    Discharge medications:      Medication List      You have not been prescribed any medications.         Allergy:  No Known Allergies    Indications for admission  Emily Haynes is a 32 y.o. [id number] admitted at [redacted]w[redacted]d for IOL 2/2 SI PreE without SF.    Hospital Course  The patient presents OB ED for evaluation of elevated BP and admitted to L & D for IOL. On arrival, had no complaints. Patient had history of shoulder dystocia in previous pregnancies, but declined recommended scheduled pCS. After induction and progression to 7/80/-1, patient did not progress further despite >6 hours of pitocin at 34mu with inadequate contractions. At that time, CPD was suspected for fetal macrosomia and pCS was recommended. Patient was then taken to the OR for primary cesarean section, please see operative report for details.     gDM  Based on glucola testing at prenatal visits >250, patient met criteria for A1DM. However, patient declined further management or testing with DAPP throughout pregnancy. Patient declined patterning post partum. Patient A1c 6.7 on admission. Patient recommended for further management post partum.    SI PreE w/ SF  Patient presented to OBED 2/2 contractions and noted to have elevated BP in triage as well as P:C elevated to 0.61 meeting criteria for PreE w/out SF. Patient reported history of HTN in previous pregnancies and with elevated BP at previous clinic visits,  thus meeting criteria for SI PreE. Patient remained MRBP intrapartum. Immediately postpartum, patient was found to have S SRBP requiring acute treatment. Patient was counseled on standard of care including Mg for seizure prophylaxis at that time. Patient was amenable to acute BP treatment, but declined Mg. Patient was extensively counseled on risks of progression to eclampsia. She continued to have SRBP overnight. She continued to decline Mg. POD#1 patient had stable BP in MR to normal range without any worsening s/s. Patient enrolled in babyscripts and close PP follow up scheduled.     [place]  Gestational Thrombocytopenia  Physicians called to bedside postop 2/2 heavy bleeding noted on chuck pads. OB STAT called. Bimanual done and evacuation of clot. TXA, hemabate x2, miso x1 given. [name] placed per protocol. MTP initiated and 2 uRBC, 2u FFP given. H&H continued to be stable. Coags collected and stable. [name] removed per protocol. Total QBL 1625. Bleeding resolved on removal of [name]. Patient remained stable with appropriate lochia throughout remainder of post partum admission. Platelets on admission 124 and 111 on discharge.      On POD 2, patient met goals for discharge including ambulating without difficulty, passing flatus, tolerating po, pain controlled on oral medications. Precautions and wound care were discussed. [name] will follow up with her obstetrician in 2 weeks for wound check and 6  weeks for postpartum follow up appointment.    Rh status: pos    Rubella status: Immune    Birth control method: Nexplanon at PPV    Postpartum CBC:   Lab Results   Component Value Date    WBC 9.9 06/12/2022    HGB 8.0 (L) 06/12/2022    HCT 23.5 (L) 06/12/2022    MCV 76.8 (L) 06/12/2022    PLT 111 (L) 06/12/2022      Lab Results   Component Value Date    CREATININE 0.56 (L) 06/11/2022    BUN 3 (L) 06/11/2022    NA 142 06/11/2022    K 3.6 06/11/2022    CL 110 06/11/2022    CO2 20 (L) 06/11/2022     Lab Results   Component  Value Date    ALT 10 06/11/2022    AST 17 06/11/2022    ALKPHOS 75 06/11/2022    BILITOT 0.4 06/11/2022     Lab Results   Component Value Date    URICACID 8.0 06/11/2022     Lab Results   Component Value Date    LDH 308 (H) 06/11/2022       Breastfeeding: plans to bottle feed    Infant Information: Please see above delivery summary    Discharge Instructions  1. Condition on discharge - stable  2. Activity - Instructed to not lift anything heavier than baby or have sexual intercourse until approved by primary obstetrician.   3. Diet - regular  4. Patient to return with uncontrolled abdominal pain, fevers >100.3, uncontrolled nausea/vomiting, vaginal bleeding soaking through 1 pad per hour x 2 hours, or any other concerning symptoms.  5. Follow up - 2 weeks for incision check, 6 weeks for postpartum follow up appointment.      [place] 06/20/22 BP check  [facility] 07/04/22 PPV  No future appointments.        Danelle Earthly, MD PGY-1  Obstetrics and Gynecology          Delivery Information for Girl Emily Haynes    Labor Events:    Preterm labor: No   Labor onset date:     Labor onset time:     Antenatal steroids:     Rupture date: 06/10/2022   Rupture time: 8:30 AM   Rupture type: Artificial   Fluid Color: Clear   Dilation complete date:     Dilation complete time:     Induction: Oxytocin;AROM   Induction indication: Elective;Mild Preeclampsia;Hypertension   Augmentation:     Pushing Started:     Labor Complications     Additional Complications:     Cervical ripening:            Antibiotics received during labor?         Labor Length:    First Stage:   hours,   minutes    Second Stage:   hours,   minutes   Third Stage: 0 hours, 5 minutes       Delivery:    Episiotomy: None       Laceration Repaired?   Pitocin started within 60 seconds of delivery: No    Perineal: None     Periurethral:       Labial:       Sulcus:       Vaginal:       Cervical:           Repair suture: None   Repair # of  packets:     Blood loss (ml):     Counts          Forceps attempted? No   Vacuum extractor attempted No   Indications:     Forceps application location:         Asst Del Details (If applicable): No data filed          Presentation/Position: Vertex;            Anesthesia:    Method:  Epidural   Analgesics:           Other Procedures: None    Birth information:  Date of birth: 06/11/2022   Time of birth: 1:50 AM   Sex: female   Delivery type: C-Section, Low Transverse   Breech type (if applicable):     Observed anomalies/comments       Gestational Age: [redacted]w[redacted]d      Living? Living          APGARS  One minute Five minutes Ten minutes Fifteen minutes Twenty minutes   Skin color: 0   0             Heart rate: 2   2             Grimace: 2   2              Muscle tone: 2   2              Breathing: 2   2              Totals: 8   8                  Shoulder Dystocia:   Shoulder Dystocia    Shoulder dystocia present: No   Delivery of posterior arm:        Fetal clavicular fracture:        Gaskin maneuver:        McRoberts maneuver:        Rubin maneuver:        Suprapubic pressure:        Symphysiotomy:        Woods screw maneuver:        Zavanelli maneuver:                             Newborn Measurements:  Weight: 5533 g (12 lb 3.2 oz)    Length: 54 cm (21.25)    Head circumference (in): 14.567    Chest circumference (in):     Observed anomalies:     Date of Fetal Demise:   Weight: 195.2 oz Length: 21.25     Head circumference: 0.37 m             Cord Information: 3 vessels     Disposition of cord blood: Discarded    Blood gases sent? Yes  Complications: None   Cord clamped date/time:      Resuscitation: Suctioning     Placenta:  Delivered: 06/11/2022  1:55 AM  Appearance: Intact  Removal: Expressed    Disposition: pathology       Delivery Providers:    Delivery Clinician: Grandville Silos    Other Providers: Lamount Cohen, RANDY M;ROSE, REBECCA E;RECCHIA, ASHLI;GOEL, KRITI Delivery Nurse;Charge Nurse;Baby  Nurse;Technician;Resident

## 2022-06-13 NOTE — Discharge Instructions (Signed)
[facility]    Obstetric Discharge Instructions    ACTIVITY:    Avoid lifting anything over 10 pounds, driving, using the sweeper or leaving your home for an extended period of time. You may resume activities that you feel are appropriate                                                   No exercise until you follow-up with your doctor                                             You may go up and down stairs as necessary    You should shower daily    After your delivery, you may return to work/school/normal activity as directed by your physician on your follow-up visit    DIET:    Maintain good dietary habits. Include the basic food groups: meats, dairy, cereals, fruits and vegetables    If breastfeeding, increase your calories and fluids. See specific diet for breast feeding mothers located in your breastfeeding handouts    INCISION/WOUND CARE:    Always use hand sanitizer or wash your hands before touching your incision    Take a shower every day-avoid taking a tub bath, sitting in hot tubs or swimming in a pool for 6 weeks after surgery    Wash your incision with soapy water and rinse with clean water at least once a day. You can let soapy water from the shower run over your incision. Gently pat the incision dry after showering    If the pieces of tape on your incision do not fall off within 1 week, please remove them    Keep your incision dry (except when washing it) and keep the folds of the skin around the incision dry. Expose the incision to as much air as possible    Do not pick at the scabs-they help protect your incision    If you use a binder, remove it 3-4 times a day to allow air to get to your incision    If you are diabetic, you are at a higher risk for infection.-watch your incision closely and keep your blood sugars under control    Take your medications exactly as they have been prescribed for you    Avoid smoking and illegal drug use    Call your doctor if:  *Your incision becomes red,  swollen, drains pus or is warm to the touch              PERINEAL CARE:    You will be sent home with a peri-bottle (squeeze bottle) to cleanse the perineum (vaginal area) after each time you urinate of have a bowel movement    Continue good feminine hygiene by wiping from front to back    If you have stitches, you will receive Tucks and Demoplast spray. If using Demoplast, hold the can at least 12 inches away from your stitches to prevent burning. Your nurse will instruct you more in their use    Change your sanitary pad frequently    If hemorrhoids are present, you may apply Tucks    Use of a warm sitz bath is encouraged to speed the healing  process of the perineum (20 minutes twice a day)- this may be done using a disposable sitz bath or a tub type as long as the tub is thoroughly scrubbed with cleaning agents and rinsed prior to each use    LOCHIA/VAGINAL BLEEDING:    Appearance can change from red to pink to [place] or from red to brown. This can last up to six weeks    Call your doctor or midwife if the lochia (bloody discharge) is greenish or has a foul odor    Call your doctor or midwife if the bleeding increases enough to cause soaking of a sanitary pad in an hour and/or if clots are passed that are larger than a small egg      BREAST CARE (non-breastfeeding):    Wear a tight fitting supportive bra    Avoid nipple stimulation if not breastfeeding    Use ice packs to relieve discomfort if you become engorged (swollen and hard). Do not place ice packs directly on skin, wrap them in a towel first. Place then 20 minutes on and 20 minutes off breast to prevent rebound effect    Breast pads are available at your drug store to prevent leaking through clothing    Keep your back to warm water while showering; this prevents milk stimulation    SEX/BIRTH CONTROL:    No sex, douching, or tampons for at least 6 weeks    There are many methods of birth control available. Consult your doctor/midwife    SPECIAL INSTRUCTIONS:  Please call your doctor IMMEDIATELY if you are having trouble with:    Increased vaginal bleeding (more than one pad per hour)    Foul smelling vaginal discharge    Problems with bowel movements or urination    Leg pains or increased swelling    Shortness of breath    Problems with breasts, especially redness, pain or engorgement    Feeling depressed, tearful, unable to cope, having suicidal or homicidal thoughts    Your temperature goes above 100.4    FOLLOW-UP:    Call TODAY for an appointment or as soon as possible if an appointment hasn't already been made for you, if a follow up visit has not been arranged by the Discharge Care Coordinators or if you have any questions regarding your appointments please call [phone].         I understand that if any problems occur once I am at at home I am to contact my physician.    I understand and acknowledge receipt of the instructions indicated above.    I have verified Mother and Infant identification bands match at the time of discharge.

## 2022-06-13 NOTE — Care Coordination-Inpatient (Signed)
Met with mom to introduce self and discharge case management program. Confirmed with mom that she has a car seat and crib for infant, and all needed baby supplies. Reinforced behaviors/practices that can decrease an infant's risk for sleep related deaths with mom, and verbal understanding received. Assessed transportation barriers for follow-up appointments after discharge. Transportation will be own car.  Plans for discharge today.  Mom states she has support at home from spouse. PHQ-9 completed; score = 0 RN Reviewed s/s of PPD and when patient should contact provider with concerns regarding change of mood. Reviewed Discharge RN Case Manager contact information, and encouraged to call with questions.         No home health referral to [place] for nurse visit due to COVID-19 restrictions. [facility] appointment [facility] infant at [facility] scheduled on 3/6 at 1:00pm. Discharge summary for infant will be faxed to pediatric office at discharge.  Postpartum follow up appointment for mom at [facility] scheduled for 3/12 at 11:30am and 3/26 at 9:45am. Confirmed scheduled appointments with patient.          Discussed S/S of urgent maternal warning signs. Discussed when to call 911 and when to call the provider.    Informed patient to call 911 if they experience pain in their chest, obstructed breathing, seizures, or if they have thoughts of hurting themselves or their baby. Informed patient to notify the responders that they have given birth within the past year.    Educational handout provided, patient verbalized understanding.     Reviewed Baby Scripts program with patient. Educated patient on how and when to take blood pressure reading. Patient verbalized understanding with no further questions.

## 2022-06-13 NOTE — Plan of Care (Signed)
Problem: Psychosocial Needs  Goal: Demonstrates ability to cope with hospitalization/illness  Description: Assess and monitor patients ability to cope with his/her illness.  Outcome: Progressing     Problem: Acute Pain  Description: Patient's pain progressing toward patient's stated pain goal  Goal: Patient displays improved well-being such as baseline levels for pulse, BP, respirations and relaxed muscle tone or body posture  Outcome: Progressing     Problem: C-Section Postpartum Care  Goal: Patient vital signs are stable  Outcome: Progressing  Goal: Patient is able to void/empty bladder after catheter is removed  Description: Assess bladder and bladder function.  Outcome: Progressing     Problem: Impaired physical mobility  Goal: Ambulates independently  Outcome: Progressing     Problem: Pain  Goal: Patient's pain is progressing toward patient's stated pain goal  Description: Assess and monitor patient's pain using appropriate pain scale. Collaborate with interdisciplinary team and initiate plan and interventions as ordered. Re-assess patient's pain level 30 - 60 minutes after pain management intervention.   Outcome: Progressing     Assessment completed as charted, VSS. Fundus firm and midline. Lochia small, no odor. Low TV abd incision w steri strips in place. Pt reports 7/10 pain, see eMAR for intervention. Whiteboard updated, CLWR. All needs met. Pt instructed to call out with needs or changes.

## 2022-06-13 NOTE — Nursing Note (Signed)
Completed discharge education, pt verbalized understanding, paperwork signed, IV removed. Follow up apt reviewed with pt, home medications reviewed and waiting to be delivered by pharmacy. Adequate for discharge, no questions at this time.
